# Patient Record
Sex: Male | Born: 1940 | Race: White | Hispanic: No | Marital: Married | Smoking: Never smoker
Health system: Southern US, Community
[De-identification: ages and names within clinical notes are randomized; demographics above are authoritative.]

## PROBLEM LIST (undated history)

## (undated) DIAGNOSIS — G4733 Obstructive sleep apnea (adult) (pediatric): Secondary | ICD-10-CM

## (undated) DIAGNOSIS — E119 Type 2 diabetes mellitus without complications: Secondary | ICD-10-CM

## (undated) DIAGNOSIS — M1990 Unspecified osteoarthritis, unspecified site: Secondary | ICD-10-CM

## (undated) DIAGNOSIS — I10 Essential (primary) hypertension: Secondary | ICD-10-CM

## (undated) DIAGNOSIS — E1143 Type 2 diabetes mellitus with diabetic autonomic (poly)neuropathy: Secondary | ICD-10-CM

## (undated) DIAGNOSIS — K229 Disease of esophagus, unspecified: Secondary | ICD-10-CM

## (undated) DIAGNOSIS — I679 Cerebrovascular disease, unspecified: Secondary | ICD-10-CM

## (undated) DIAGNOSIS — E7800 Pure hypercholesterolemia, unspecified: Secondary | ICD-10-CM

## (undated) DIAGNOSIS — F411 Generalized anxiety disorder: Secondary | ICD-10-CM

## (undated) DIAGNOSIS — R251 Tremor, unspecified: Secondary | ICD-10-CM

## (undated) DIAGNOSIS — Z9989 Dependence on other enabling machines and devices: Secondary | ICD-10-CM

## (undated) HISTORY — DX: Essential (primary) hypertension: I10

## (undated) HISTORY — PX: KNEE ARTHROSCOPY: SHX127

## (undated) HISTORY — DX: Generalized anxiety disorder: F41.1

## (undated) HISTORY — PX: CARPAL TUNNEL RELEASE: SHX101

## (undated) HISTORY — DX: Type 2 diabetes mellitus with diabetic autonomic (poly)neuropathy: E11.43

## (undated) HISTORY — DX: Type 2 diabetes mellitus without complications: E11.9

## (undated) HISTORY — DX: Obstructive sleep apnea (adult) (pediatric): G47.33

## (undated) HISTORY — DX: Disease of esophagus, unspecified: K22.9

## (undated) HISTORY — PX: POLYPECTOMY: SHX149

## (undated) HISTORY — DX: Obstructive sleep apnea (adult) (pediatric): Z99.89

## (undated) HISTORY — DX: Pure hypercholesterolemia, unspecified: E78.00

## (undated) HISTORY — PX: ROTATOR CUFF REPAIR: SHX139

## (undated) HISTORY — PX: HEMORRHOID SURGERY: SHX153

## (undated) HISTORY — DX: Unspecified osteoarthritis, unspecified site: M19.90

## (undated) HISTORY — DX: Tremor, unspecified: R25.1

## (undated) HISTORY — PX: VASECTOMY: SHX75

## (undated) HISTORY — DX: Cerebrovascular disease, unspecified: I67.9

---

## 2006-11-13 ENCOUNTER — Inpatient Hospital Stay (HOSPITAL_COMMUNITY): Admission: RE | Admit: 2006-11-13 | Discharge: 2006-11-15 | Payer: Self-pay | Admitting: Orthopedic Surgery

## 2007-04-11 ENCOUNTER — Ambulatory Visit (HOSPITAL_BASED_OUTPATIENT_CLINIC_OR_DEPARTMENT_OTHER): Admission: RE | Admit: 2007-04-11 | Discharge: 2007-04-11 | Payer: Self-pay | Admitting: Orthopedic Surgery

## 2010-10-26 NOTE — H&P (Signed)
NAME:  Jacob Patel, Jacob Patel NO.:  [id number]   MEDICAL RECORD NO.:  [id number]          PATIENT TYPE:  INP   LOCATION:  NA                           FACILITY:  [facility]   PHYSICIAN:  Mila Homer. Sherlean Foot, M.D. DATE OF BIRTH:  1941-05-11   DATE OF ADMISSION:  11/13/2006  DATE OF DISCHARGE:                              HISTORY & PHYSICAL   CHIEF COMPLAINT:  Left knee pain for the last 5-10 years.   HISTORY OF PRESENT ILLNESS:  This 70 year old white male patient  presented to Dr. Sherlean Foot with a 5-10-year history of gradual onset  progressively worsening left knee pain.  He does have a history of a  left knee arthroscopy done in 2001 by Dr. Bevely Palmer but has had no other  injury or any other surgery to the knee.  At this point the left knee  pain is described as an intermittent ache to sharp sensation mostly over  the medial joint line without radiation.  Pain increases with  weightbearing and then decreases if he limits the amount of  weightbearing he does.  The knee does catch, grind and swell at times  but there is no popping, locking or giving way.  Does not keep him up at  night.  He has received cortisone injections in the past with a moderate  amount of relief and is not taking any other meds for pain at this time.  He has not received viscous supplementation and he does not ambulate  with any assistive devices.   ALLERGIES:  No known drug allergies.   CURRENT MEDICATIONS:  1. Enalapril/hydrochlorothiazide 10/25 mg 1 tablet p.o. q.a.m..  2. Coreg 6.25 mg 1 tablet p.o. b.i.d..  3. Prevacid 30 mg 1 tablet p.o. q.p.m.Marland Kitchen  4. Fexofenadine 180 mg 1 tablet p.o. q.p.m..  5. Crestor 20 mg 1 tablet p.o. q.p.m..  6. Metformin HCL 500 mg 1 tablet p.o. b.i.d..  7. Nasonex nasal spray 50 mcg 2 sprays in each nares q.a.m..  8. Astelin nasal spray 137 mcg two squirts in each nares q.a.m..  #.  Citrucel tablets 2 tablets p.o. b.i.d..  #.  Chondroitin and glucosamine 2 tablets p.o. q.a.m.  which he quit  several days ago.   PAST MEDICAL HISTORY:  1. Hypertension.  2. Gastroesophageal reflux disease.  3. Allergies.  4. Type 2 diabetes mellitus just recently diagnosed.  5. Hypercholesterolemia.  6. Sleep apnea which he uses a CPAP.   PAST SURGICAL HISTORY:  1. Left knee arthroscopy 2001 by Dr. Bevely Palmer.  2. Hemorrhoidectomy 2006.  3. Right carpal tunnel release by Dr. Bevely Palmer 1995.  4. Removal of multiple skin cancers.  5. Excision of a lipoma in February 2008.  He denies any complications      from as above mentioned procedures.   SOCIAL HISTORY:  He denies any history of cigarette smoking, alcohol use  or drug use.  He is married and lives with his wife in a two-story  house.  He has two living children. one child who was stillborn.  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 in October 1999.  His medical  doctor is Dr. Gwendlyn Deutscher in [place].   FAMILY HISTORY:  Mother is alive at age 36 with hypertension.  Father  died at age of 61 with heart disease, heart attack and hypertension.  He  has three brothers and three sisters all alive.  Brothers with a history  of hypertension, one sister with a history of brain type tumor.  Siblings range in age from 73-63.  His daughter is 73, son is 63 and  they are both alive and well.  His one son died was stillborn.   REVIEW OF SYSTEMS:  He does have a history of hypertension and  hemorrhoids which we mentioned earlier.  He does have sleep apnea which  he uses a CPAP.  All other systems were negative and noncontributory.   PHYSICAL EXAMINATION:  Well-developed, well-nourished white male in no  acute distress.  Talks easily with examiner.  Mood and affect are  appropriate.  Walks with a regular gait.  No assistive devices.  Mood  and affect are appropriate.  Height 5 feet 10 inches, weight 235 pounds,  BMI is 33.  VITAL SIGNS:  Temp: 98.3 degrees  Fahrenheit, pulse 80, respirations 18  and BP 138/72.  HEENT:  Normocephalic, atraumatic without frontal or maxillary sinus  tenderness to palpation.  Conjunctivae pink.  Sclerae anicteric.  PERLA.  EOMs intact.  No visible external ear deformities.  Hearing grossly  intact.  Tympanic membranes pearly gray bilaterally with good light  reflex.  Nose: The nasal septum midline.  Nasal mucosa pink and moist  without exudates or polyps noted.  Buccal mucosa pink and moist.  Dentition in good repair.  Pharynx without erythema or exudates.  Tongue  and uvula midline.  Tongue without fasciculations and uvula rises  equally with phonation.  NECK:  No visible masses or lesions noted.  Trachea midline.  No  palpable lymphadenopathy nor thyromegaly.  Carotids +2 bilaterally  without bruits.  Full range of motion nontender to palpation along the  cervical spine.  CARDIOVASCULAR:  Heart rate and rhythm regular.  S1-S2 present without  rubs, clicks or murmurs noted.  RESPIRATORY:  Respirations even and unlabored.  Breath sounds clear to  auscultation bilaterally without rales or wheezes noted.  ABDOMEN:  Rounded abdominal contour.  Bowel sounds present x4 quadrants.  Soft, nontender to palpation without hepatosplenomegaly nor CVA  tenderness.  Femoral pulses +1 bilaterally.  Nontender to palpation  along the vertebral column.  BREASTS/GU/RECTAL:  These exams deferred at this time.  MUSCULOSKELETAL:  No obvious deformities bilateral upper extremities  with full range of motion these extremities without pain.  Radial pulses  +2 bilaterally.  He has full range of motion of his hips, ankles and  toes bilaterally.  DP and PT pulses are +2.  No calf pain with  palpation.  Negative Homans' sign bilaterally.   Right knee has full extension and flexion to 125 degrees with minimal  crepitus.  Skin is intact.  No erythema or ecchymosis.  There is no pain with palpation along the joint line.  No effusion.   Stable to varus  valgus stress.  Negative anterior drawer.  Left knee skin is intact.  No  erythema or ecchymosis.  He does have a +2 effusion in the knee and full  extension of the knee but flexion to 110 degrees with a moderate amount  of crepitus.  He is tender to palpation over the  medial joint line none  laterally.  Stable to varus and valgus stress.  Negative anterior  drawer.  NEUROLOGIC::  Alert and oriented x3.  Cranial nerves II-XII are grossly  intact.  Strength 5/5 bilateral upper and lower extremities.  Rapid  alternating movements intact.  Deep tendon reflexes 2+ bilateral upper  and lower extremities.  Sensation intact to light touch.   RADIOLOGIC FINDINGS:  X-rays taken of his left knee in March 2008 show  severe osteoarthritis in the medial and patellofemoral compartments of  the knee.   IMPRESSION:  1. End-stage osteoarthritis left knee.  2. Hypertension.  3. Type 2 diabetes mellitus.  4. Hypercholesterolemia.  5. Gastroesophageal reflux disease.  6. Allergies.  7. Sleep apnea.   PLAN:  The patient will be admitted to [facility] on November 13, 2006 where he will undergo a left total knee arthroplasty by Dr. Mila Homer. Lucey.  He will undergo all the routine preoperative laboratory tests  and studies prior to this procedure.  If there any medical issues while  he is hospitalized we will consult one the hospitalists.      Legrand Pitts Duffy, P.A.    ______________________________  Mila Homer. Sherlean Foot, M.D.    KED/MEDQ  D:  11/07/2006  T:  11/07/2006  Job:  [id number]

## 2010-10-26 NOTE — Op Note (Signed)
NAME:  Jacob Patel, Jacob Patel NO.:  [id number]   MEDICAL RECORD NO.:  [id number]          PATIENT TYPE:  INP   LOCATION:  2899                         FACILITY:  [facility]   PHYSICIAN:  Mila Homer. Sherlean Foot, M.D. DATE OF BIRTH:  1940/12/01   DATE OF PROCEDURE:  11/13/2006  DATE OF DISCHARGE:                               OPERATIVE REPORT   SURGEON:  Mila Homer. Sherlean Foot, M.D.   ASSISTANTArlys John D. Petrarca, P.A.-C.   ANESTHESIA:  General.   PREOPERATIVE DIAGNOSIS:  Left knee osteoarthritis.   POSTOPERATIVE DIAGNOSIS:  Left knee osteoarthritis.   PROCEDURE:  Left total knee arthroplasty.   INDICATIONS FOR PROCEDURE:  The patient is a 70 year old white male with  failure of conservative measures for osteoarthritis of the left knee.  Informed consent was obtained.   DESCRIPTION OF PROCEDURE:  The patient was laid supine and administered  general anesthesia.  The Foley catheter was placed.  The left leg was  prepped and draped in the usual sterile fashion.  The extremity was  exsanguinated with Esmarch and tourniquet inflated to 350 mmHg and set  for an hour.   I made a midline incision with a #10 blade that was 8 inches in length.  I used a fresh blade to make a median parapatellar arthrotomy and  perform a synovectomy.  I elevated the deep MCL off the medial crest of  the tibia with a #10 blade as well and released the MCL somewhat as this  was a varus knee.  I then used the extramedullary alignment system on  the tibia to make a perpendicular cut to the anatomic axis of the tibia  and removed 2 mm of bone off the inside.  I used the patellar clamp,  resected 9 mm with a patellar reamer and drilled holes through a 35-mm  template to recreate the 23-mm thickness.  I then used the  intramedullary alignment system on the femur with 6 degree valgus cut.  It measured to a size F.  With the F four-in-one cutting block into  place, I made the anterior-posterior chamfer cuts.  I  removed the cut  surfaces of bone.  I placed a lamina spreader in the knee and then  removed the ACL, PCL, medial and lateral menisci and posterior condylar  osteophytes.  A 10-mm spacer block fit well.  A 12-mm spacer block fit.  We were able to do a little bit more medial releasing.  I then finished  the femur with a size F block drill and keel cutting for the box and  drilling for the lag holes.  I used the size 7 tibial tray drill and  keel to purvey the tibia and then trialed with a size F femur, 7 tibia,  12 insert, 35 patella and had excellent flexion/extension gap balance,  excellent patellar tracking.  A dropped in and angled back to 125-130  degrees easily.  I then removed it, components were copiously  irrigated.  I then cemented in the components, removed excess cement and  allowed the cement harden in extension.   I placed a  Hemovac coming out superolateral and deep in the arthrotomy.  I placed a pain catheter coming out superomedial and superficial of the  arthrotomy.  We let the tourniquet down, when the cement was hard,  at  51 minutes.  I obtained hemostasis and irrigated again.  I then closed  the arthrotomy with figure-of-eight #1 Vicryl sutures, deep soft tissues  with interrupted 0 Vicryl sutures in a subcuticular 2-0 Vicryl stitch  and the skin with staples.  Dressed with Xeroform dressing, sponges,  sterile Webril and TED stocking.   COMPLICATIONS:  None.   DRAINS:  Hemovac one.  Pain catheter.   ESTIMATED BLOOD LOSS:  Three hundred mL.   TOURNIQUET TIME:  Fifty-one minutes.           ______________________________  Mila Homer Sherlean Foot, M.D.     SDL/MEDQ  D:  11/13/2006  T:  11/13/2006  Job:  [id number]

## 2010-10-26 NOTE — Op Note (Signed)
NAME:  Jacob Patel, Jacob Patel NO.:  [id number]   MEDICAL RECORD NO.:  [id number]          PATIENT TYPE:  AMB   LOCATION:  DSC                          FACILITY:  [facility]   PHYSICIAN:  Mila Homer. Sherlean Foot, M.D. DATE OF BIRTH:  14-Aug-1940   DATE OF PROCEDURE:  04/11/2007  DATE OF DISCHARGE:  04/11/2007                               OPERATIVE REPORT   SURGEON:  Mila Homer. Sherlean Foot, M.D.   ASSISTANT:  Rexene Edison, P.A.   ANESTHESIA:  General.   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   PROCEDURE:  Right shoulder arthroscopy, subacromial decompression,  glenohumeral debridement and mini open rotator cuff repair.   INDICATIONS FOR PROCEDURE:  The patient is a 70 year old white male with  MRI evidence of a large rotator cuff tear.  Informed consent was  obtained.   DESCRIPTION OF PROCEDURE:  The patient was laid supine and administered  general anesthesia, then placed in the beach-chair position.  The right  shoulder was prepped and draped in the usual sterile fashion.  Anterior,  posterior and direct lateral portals were created a #11 blade, blunt  trocar and cannula.  Diagnostic arthroscopy revealed an anterior labral  tear; this was debrided through the anterior portal with a 3.5 Gator  shaver.  I then redirected the scope to the subacromial space.  From the  direct lateral portal, I performed bursectomy with the Gator shaver.  I  then performed an anterolateral acromioplasty all the way to and then  into the distal clavicle, performing a distal clavicle excision through  the direct lateral anterior portals.  I then debrided the rotator cuff;  this was a large tear approximately 5-6 cm in length.  I then converted  to the mini open technique by extending the direct  lateral portal up 3  cm and holding the deltoid retracted with the Arthrex shoulder  retractor, but not taking any of the deltoid off the acromion, but  splitting it through it raphe.  I then denuded the barrier of the  humerus with a 4-mm cylindrical bur and placed four 5.5 Bio-corkscrews.  I placed 4 modified Mason Balgobin sutures, repairing the rotator cuff into  the bony trough; this afforded excellent closure.  I then irrigated and  closed with 0 and 2-0 Vicryls and Steri-Strips throughout.   COMPLICATIONS:  None.   DRAINS:  None.           ______________________________  Mila Homer. Sherlean Foot, M.D.     SDL/MEDQ  D:  04/11/2007  T:  04/12/2007  Job:  [id number]

## 2011-03-23 LAB — I-STAT 8, (EC8 V) (CONVERTED LAB)
Acid-Base Excess: 3 — ABNORMAL HIGH
Bicarbonate: 29.7 — ABNORMAL HIGH
Glucose, Bld: 93
Hemoglobin: 14.3
Potassium: 3.5
Sodium: 140
TCO2: 31
pH, Ven: 7.345 — ABNORMAL HIGH

## 2011-03-31 LAB — BASIC METABOLIC PANEL
BUN: 10
BUN: 9
CO2: 26
Calcium: 8.3 — ABNORMAL LOW
Creatinine, Ser: 0.92
GFR calc non Af Amer: >60
GFR calc non Af Amer: >60
Glucose, Bld: 121 — ABNORMAL HIGH
Glucose, Bld: 125 — ABNORMAL HIGH
Potassium: 3.3 — ABNORMAL LOW
Potassium: 3.5
Sodium: 135

## 2011-03-31 LAB — CBC
HCT: 34.3 — ABNORMAL LOW
Hemoglobin: 11.8 — ABNORMAL LOW
MCHC: 34.3
Platelets: 157
Platelets: 194
RDW: 13
RDW: 13.5
WBC: 7.9

## 2011-07-21 DIAGNOSIS — M7150 Other bursitis, not elsewhere classified, unspecified site: Secondary | ICD-10-CM | POA: Diagnosis not present

## 2011-07-21 DIAGNOSIS — M722 Plantar fascial fibromatosis: Secondary | ICD-10-CM | POA: Diagnosis not present

## 2011-08-04 DIAGNOSIS — E119 Type 2 diabetes mellitus without complications: Secondary | ICD-10-CM | POA: Diagnosis not present

## 2011-08-04 DIAGNOSIS — M7150 Other bursitis, not elsewhere classified, unspecified site: Secondary | ICD-10-CM | POA: Diagnosis not present

## 2011-08-04 DIAGNOSIS — M722 Plantar fascial fibromatosis: Secondary | ICD-10-CM | POA: Diagnosis not present

## 2011-08-10 DIAGNOSIS — E7800 Pure hypercholesterolemia, unspecified: Secondary | ICD-10-CM | POA: Diagnosis not present

## 2011-08-10 DIAGNOSIS — M545 Low back pain: Secondary | ICD-10-CM | POA: Diagnosis not present

## 2011-08-10 DIAGNOSIS — E119 Type 2 diabetes mellitus without complications: Secondary | ICD-10-CM | POA: Diagnosis not present

## 2011-08-10 DIAGNOSIS — K219 Gastro-esophageal reflux disease without esophagitis: Secondary | ICD-10-CM | POA: Diagnosis not present

## 2011-09-22 DIAGNOSIS — M7150 Other bursitis, not elsewhere classified, unspecified site: Secondary | ICD-10-CM | POA: Diagnosis not present

## 2011-09-22 DIAGNOSIS — M722 Plantar fascial fibromatosis: Secondary | ICD-10-CM | POA: Diagnosis not present

## 2011-12-21 DIAGNOSIS — D126 Benign neoplasm of colon, unspecified: Secondary | ICD-10-CM | POA: Diagnosis not present

## 2011-12-21 DIAGNOSIS — Z86010 Personal history of colonic polyps: Secondary | ICD-10-CM | POA: Diagnosis not present

## 2011-12-21 DIAGNOSIS — K5730 Diverticulosis of large intestine without perforation or abscess without bleeding: Secondary | ICD-10-CM | POA: Diagnosis not present

## 2012-01-25 DIAGNOSIS — Z85828 Personal history of other malignant neoplasm of skin: Secondary | ICD-10-CM | POA: Diagnosis not present

## 2012-01-25 DIAGNOSIS — L570 Actinic keratosis: Secondary | ICD-10-CM | POA: Diagnosis not present

## 2012-01-25 DIAGNOSIS — D0439 Carcinoma in situ of skin of other parts of face: Secondary | ICD-10-CM | POA: Diagnosis not present

## 2012-03-07 DIAGNOSIS — Z85828 Personal history of other malignant neoplasm of skin: Secondary | ICD-10-CM | POA: Diagnosis not present

## 2012-03-23 DIAGNOSIS — J342 Deviated nasal septum: Secondary | ICD-10-CM | POA: Diagnosis not present

## 2012-03-23 DIAGNOSIS — J309 Allergic rhinitis, unspecified: Secondary | ICD-10-CM | POA: Diagnosis not present

## 2012-03-23 DIAGNOSIS — J343 Hypertrophy of nasal turbinates: Secondary | ICD-10-CM | POA: Diagnosis not present

## 2012-07-30 DIAGNOSIS — E119 Type 2 diabetes mellitus without complications: Secondary | ICD-10-CM | POA: Diagnosis not present

## 2012-07-30 DIAGNOSIS — K219 Gastro-esophageal reflux disease without esophagitis: Secondary | ICD-10-CM | POA: Diagnosis not present

## 2012-07-30 DIAGNOSIS — E7800 Pure hypercholesterolemia, unspecified: Secondary | ICD-10-CM | POA: Diagnosis not present

## 2012-07-30 DIAGNOSIS — I10 Essential (primary) hypertension: Secondary | ICD-10-CM | POA: Diagnosis not present

## 2012-09-05 DIAGNOSIS — L570 Actinic keratosis: Secondary | ICD-10-CM | POA: Diagnosis not present

## 2012-09-05 DIAGNOSIS — D0420 Carcinoma in situ of skin of unspecified ear and external auricular canal: Secondary | ICD-10-CM | POA: Diagnosis not present

## 2012-09-05 DIAGNOSIS — Z85828 Personal history of other malignant neoplasm of skin: Secondary | ICD-10-CM | POA: Diagnosis not present

## 2012-09-05 DIAGNOSIS — D0439 Carcinoma in situ of skin of other parts of face: Secondary | ICD-10-CM | POA: Diagnosis not present

## 2012-09-18 DIAGNOSIS — E119 Type 2 diabetes mellitus without complications: Secondary | ICD-10-CM | POA: Diagnosis not present

## 2013-01-07 DIAGNOSIS — Z85828 Personal history of other malignant neoplasm of skin: Secondary | ICD-10-CM | POA: Diagnosis not present

## 2013-01-07 DIAGNOSIS — L570 Actinic keratosis: Secondary | ICD-10-CM | POA: Diagnosis not present

## 2013-05-08 DIAGNOSIS — E119 Type 2 diabetes mellitus without complications: Secondary | ICD-10-CM | POA: Diagnosis not present

## 2013-05-08 DIAGNOSIS — M24573 Contracture, unspecified ankle: Secondary | ICD-10-CM | POA: Diagnosis not present

## 2013-05-08 DIAGNOSIS — M2040 Other hammer toe(s) (acquired), unspecified foot: Secondary | ICD-10-CM | POA: Diagnosis not present

## 2013-05-08 DIAGNOSIS — M722 Plantar fascial fibromatosis: Secondary | ICD-10-CM | POA: Diagnosis not present

## 2013-05-08 DIAGNOSIS — M7150 Other bursitis, not elsewhere classified, unspecified site: Secondary | ICD-10-CM | POA: Diagnosis not present

## 2013-05-30 DIAGNOSIS — J111 Influenza due to unidentified influenza virus with other respiratory manifestations: Secondary | ICD-10-CM | POA: Diagnosis not present

## 2013-06-12 DIAGNOSIS — M722 Plantar fascial fibromatosis: Secondary | ICD-10-CM | POA: Diagnosis not present

## 2013-06-12 DIAGNOSIS — M7150 Other bursitis, not elsewhere classified, unspecified site: Secondary | ICD-10-CM | POA: Diagnosis not present

## 2013-06-12 DIAGNOSIS — E119 Type 2 diabetes mellitus without complications: Secondary | ICD-10-CM | POA: Diagnosis not present

## 2013-07-24 DIAGNOSIS — L570 Actinic keratosis: Secondary | ICD-10-CM | POA: Diagnosis not present

## 2013-07-24 DIAGNOSIS — D234 Other benign neoplasm of skin of scalp and neck: Secondary | ICD-10-CM | POA: Diagnosis not present

## 2013-07-24 DIAGNOSIS — Z85828 Personal history of other malignant neoplasm of skin: Secondary | ICD-10-CM | POA: Diagnosis not present

## 2013-08-14 DIAGNOSIS — E119 Type 2 diabetes mellitus without complications: Secondary | ICD-10-CM | POA: Diagnosis not present

## 2013-08-14 DIAGNOSIS — E7800 Pure hypercholesterolemia, unspecified: Secondary | ICD-10-CM | POA: Diagnosis not present

## 2013-08-14 DIAGNOSIS — I10 Essential (primary) hypertension: Secondary | ICD-10-CM | POA: Diagnosis not present

## 2013-09-19 DIAGNOSIS — H524 Presbyopia: Secondary | ICD-10-CM | POA: Diagnosis not present

## 2013-09-19 DIAGNOSIS — E119 Type 2 diabetes mellitus without complications: Secondary | ICD-10-CM | POA: Diagnosis not present

## 2014-01-22 DIAGNOSIS — Z85828 Personal history of other malignant neoplasm of skin: Secondary | ICD-10-CM | POA: Diagnosis not present

## 2014-01-22 DIAGNOSIS — L570 Actinic keratosis: Secondary | ICD-10-CM | POA: Diagnosis not present

## 2014-03-10 DIAGNOSIS — K219 Gastro-esophageal reflux disease without esophagitis: Secondary | ICD-10-CM | POA: Diagnosis not present

## 2014-03-10 DIAGNOSIS — I10 Essential (primary) hypertension: Secondary | ICD-10-CM | POA: Diagnosis not present

## 2014-03-10 DIAGNOSIS — E7800 Pure hypercholesterolemia, unspecified: Secondary | ICD-10-CM | POA: Diagnosis not present

## 2014-03-10 DIAGNOSIS — J309 Allergic rhinitis, unspecified: Secondary | ICD-10-CM | POA: Diagnosis not present

## 2014-05-06 DIAGNOSIS — E785 Hyperlipidemia, unspecified: Secondary | ICD-10-CM | POA: Diagnosis not present

## 2014-05-06 DIAGNOSIS — I10 Essential (primary) hypertension: Secondary | ICD-10-CM | POA: Diagnosis not present

## 2014-05-06 DIAGNOSIS — E119 Type 2 diabetes mellitus without complications: Secondary | ICD-10-CM | POA: Diagnosis not present

## 2014-05-06 DIAGNOSIS — R0602 Shortness of breath: Secondary | ICD-10-CM | POA: Diagnosis not present

## 2014-05-26 DIAGNOSIS — E119 Type 2 diabetes mellitus without complications: Secondary | ICD-10-CM | POA: Diagnosis not present

## 2014-08-13 DIAGNOSIS — L570 Actinic keratosis: Secondary | ICD-10-CM | POA: Diagnosis not present

## 2014-08-13 DIAGNOSIS — Z85828 Personal history of other malignant neoplasm of skin: Secondary | ICD-10-CM | POA: Diagnosis not present

## 2014-08-13 DIAGNOSIS — Z08 Encounter for follow-up examination after completed treatment for malignant neoplasm: Secondary | ICD-10-CM | POA: Diagnosis not present

## 2014-08-20 DIAGNOSIS — E119 Type 2 diabetes mellitus without complications: Secondary | ICD-10-CM | POA: Diagnosis not present

## 2014-08-27 DIAGNOSIS — E780 Pure hypercholesterolemia: Secondary | ICD-10-CM | POA: Diagnosis not present

## 2014-08-27 DIAGNOSIS — E1143 Type 2 diabetes mellitus with diabetic autonomic (poly)neuropathy: Secondary | ICD-10-CM | POA: Diagnosis not present

## 2014-08-27 DIAGNOSIS — Z1389 Encounter for screening for other disorder: Secondary | ICD-10-CM | POA: Diagnosis not present

## 2014-08-27 DIAGNOSIS — Z9181 History of falling: Secondary | ICD-10-CM | POA: Diagnosis not present

## 2014-09-22 DIAGNOSIS — E119 Type 2 diabetes mellitus without complications: Secondary | ICD-10-CM | POA: Diagnosis not present

## 2014-12-02 DIAGNOSIS — J209 Acute bronchitis, unspecified: Secondary | ICD-10-CM | POA: Diagnosis not present

## 2014-12-19 DIAGNOSIS — E119 Type 2 diabetes mellitus without complications: Secondary | ICD-10-CM | POA: Diagnosis not present

## 2014-12-26 DIAGNOSIS — R252 Cramp and spasm: Secondary | ICD-10-CM | POA: Diagnosis not present

## 2014-12-26 DIAGNOSIS — E780 Pure hypercholesterolemia: Secondary | ICD-10-CM | POA: Diagnosis not present

## 2014-12-26 DIAGNOSIS — I10 Essential (primary) hypertension: Secondary | ICD-10-CM | POA: Diagnosis not present

## 2014-12-26 DIAGNOSIS — E1165 Type 2 diabetes mellitus with hyperglycemia: Secondary | ICD-10-CM | POA: Diagnosis not present

## 2015-01-23 DIAGNOSIS — I10 Essential (primary) hypertension: Secondary | ICD-10-CM | POA: Diagnosis not present

## 2015-01-23 DIAGNOSIS — E780 Pure hypercholesterolemia: Secondary | ICD-10-CM | POA: Diagnosis not present

## 2015-01-23 DIAGNOSIS — G4733 Obstructive sleep apnea (adult) (pediatric): Secondary | ICD-10-CM | POA: Diagnosis not present

## 2015-01-23 DIAGNOSIS — E1165 Type 2 diabetes mellitus with hyperglycemia: Secondary | ICD-10-CM | POA: Diagnosis not present

## 2015-01-29 DIAGNOSIS — E119 Type 2 diabetes mellitus without complications: Secondary | ICD-10-CM | POA: Diagnosis not present

## 2015-01-29 DIAGNOSIS — E782 Mixed hyperlipidemia: Secondary | ICD-10-CM | POA: Diagnosis not present

## 2015-01-29 DIAGNOSIS — E669 Obesity, unspecified: Secondary | ICD-10-CM | POA: Diagnosis not present

## 2015-01-29 DIAGNOSIS — K219 Gastro-esophageal reflux disease without esophagitis: Secondary | ICD-10-CM | POA: Diagnosis not present

## 2015-02-18 DIAGNOSIS — Z08 Encounter for follow-up examination after completed treatment for malignant neoplasm: Secondary | ICD-10-CM | POA: Diagnosis not present

## 2015-02-18 DIAGNOSIS — L570 Actinic keratosis: Secondary | ICD-10-CM | POA: Diagnosis not present

## 2015-02-18 DIAGNOSIS — Z85828 Personal history of other malignant neoplasm of skin: Secondary | ICD-10-CM | POA: Diagnosis not present

## 2015-05-01 DIAGNOSIS — F419 Anxiety disorder, unspecified: Secondary | ICD-10-CM | POA: Diagnosis not present

## 2015-05-01 DIAGNOSIS — E669 Obesity, unspecified: Secondary | ICD-10-CM | POA: Diagnosis not present

## 2015-05-01 DIAGNOSIS — E785 Hyperlipidemia, unspecified: Secondary | ICD-10-CM | POA: Diagnosis not present

## 2015-05-01 DIAGNOSIS — E119 Type 2 diabetes mellitus without complications: Secondary | ICD-10-CM | POA: Diagnosis not present

## 2015-08-17 DIAGNOSIS — E1143 Type 2 diabetes mellitus with diabetic autonomic (poly)neuropathy: Secondary | ICD-10-CM | POA: Diagnosis not present

## 2015-08-17 DIAGNOSIS — E7800 Pure hypercholesterolemia, unspecified: Secondary | ICD-10-CM | POA: Diagnosis not present

## 2015-08-17 DIAGNOSIS — Z0000 Encounter for general adult medical examination without abnormal findings: Secondary | ICD-10-CM | POA: Diagnosis not present

## 2015-08-17 DIAGNOSIS — I10 Essential (primary) hypertension: Secondary | ICD-10-CM | POA: Diagnosis not present

## 2015-08-17 DIAGNOSIS — D649 Anemia, unspecified: Secondary | ICD-10-CM | POA: Diagnosis not present

## 2015-08-19 DIAGNOSIS — Z85828 Personal history of other malignant neoplasm of skin: Secondary | ICD-10-CM | POA: Diagnosis not present

## 2015-08-19 DIAGNOSIS — Z08 Encounter for follow-up examination after completed treatment for malignant neoplasm: Secondary | ICD-10-CM | POA: Diagnosis not present

## 2015-08-19 DIAGNOSIS — L570 Actinic keratosis: Secondary | ICD-10-CM | POA: Diagnosis not present

## 2015-09-28 DIAGNOSIS — H524 Presbyopia: Secondary | ICD-10-CM | POA: Diagnosis not present

## 2015-09-28 DIAGNOSIS — H2513 Age-related nuclear cataract, bilateral: Secondary | ICD-10-CM | POA: Diagnosis not present

## 2015-09-28 DIAGNOSIS — E119 Type 2 diabetes mellitus without complications: Secondary | ICD-10-CM | POA: Diagnosis not present

## 2015-12-30 DIAGNOSIS — E1143 Type 2 diabetes mellitus with diabetic autonomic (poly)neuropathy: Secondary | ICD-10-CM | POA: Diagnosis not present

## 2015-12-30 DIAGNOSIS — Z9181 History of falling: Secondary | ICD-10-CM | POA: Diagnosis not present

## 2015-12-30 DIAGNOSIS — Z1389 Encounter for screening for other disorder: Secondary | ICD-10-CM | POA: Diagnosis not present

## 2015-12-30 DIAGNOSIS — I10 Essential (primary) hypertension: Secondary | ICD-10-CM | POA: Diagnosis not present

## 2016-02-22 DIAGNOSIS — Z08 Encounter for follow-up examination after completed treatment for malignant neoplasm: Secondary | ICD-10-CM | POA: Diagnosis not present

## 2016-02-22 DIAGNOSIS — Z85828 Personal history of other malignant neoplasm of skin: Secondary | ICD-10-CM | POA: Diagnosis not present

## 2016-02-22 DIAGNOSIS — L570 Actinic keratosis: Secondary | ICD-10-CM | POA: Diagnosis not present

## 2016-05-09 DIAGNOSIS — D485 Neoplasm of uncertain behavior of skin: Secondary | ICD-10-CM | POA: Diagnosis not present

## 2016-05-09 DIAGNOSIS — L820 Inflamed seborrheic keratosis: Secondary | ICD-10-CM | POA: Diagnosis not present

## 2016-08-22 DIAGNOSIS — Z85828 Personal history of other malignant neoplasm of skin: Secondary | ICD-10-CM | POA: Diagnosis not present

## 2016-08-22 DIAGNOSIS — L570 Actinic keratosis: Secondary | ICD-10-CM | POA: Diagnosis not present

## 2016-08-22 DIAGNOSIS — Z08 Encounter for follow-up examination after completed treatment for malignant neoplasm: Secondary | ICD-10-CM | POA: Diagnosis not present

## 2016-09-13 DIAGNOSIS — E1165 Type 2 diabetes mellitus with hyperglycemia: Secondary | ICD-10-CM | POA: Diagnosis not present

## 2016-09-22 DIAGNOSIS — E1143 Type 2 diabetes mellitus with diabetic autonomic (poly)neuropathy: Secondary | ICD-10-CM | POA: Diagnosis not present

## 2016-09-22 DIAGNOSIS — Z0000 Encounter for general adult medical examination without abnormal findings: Secondary | ICD-10-CM | POA: Diagnosis not present

## 2016-09-22 DIAGNOSIS — E7800 Pure hypercholesterolemia, unspecified: Secondary | ICD-10-CM | POA: Diagnosis not present

## 2016-09-22 DIAGNOSIS — I10 Essential (primary) hypertension: Secondary | ICD-10-CM | POA: Diagnosis not present

## 2016-09-22 DIAGNOSIS — Z6832 Body mass index (BMI) 32.0-32.9, adult: Secondary | ICD-10-CM | POA: Diagnosis not present

## 2016-09-28 DIAGNOSIS — H2513 Age-related nuclear cataract, bilateral: Secondary | ICD-10-CM | POA: Diagnosis not present

## 2016-09-28 DIAGNOSIS — E119 Type 2 diabetes mellitus without complications: Secondary | ICD-10-CM | POA: Diagnosis not present

## 2016-12-28 DIAGNOSIS — S6000XA Contusion of unspecified finger without damage to nail, initial encounter: Secondary | ICD-10-CM | POA: Diagnosis not present

## 2016-12-28 DIAGNOSIS — L03019 Cellulitis of unspecified finger: Secondary | ICD-10-CM | POA: Diagnosis not present

## 2017-01-03 DIAGNOSIS — E119 Type 2 diabetes mellitus without complications: Secondary | ICD-10-CM | POA: Diagnosis not present

## 2017-01-03 DIAGNOSIS — L600 Ingrowing nail: Secondary | ICD-10-CM | POA: Diagnosis not present

## 2017-03-06 DIAGNOSIS — L570 Actinic keratosis: Secondary | ICD-10-CM | POA: Diagnosis not present

## 2017-03-06 DIAGNOSIS — Z85828 Personal history of other malignant neoplasm of skin: Secondary | ICD-10-CM | POA: Diagnosis not present

## 2017-03-06 DIAGNOSIS — Z08 Encounter for follow-up examination after completed treatment for malignant neoplasm: Secondary | ICD-10-CM | POA: Diagnosis not present

## 2017-03-13 DIAGNOSIS — D125 Benign neoplasm of sigmoid colon: Secondary | ICD-10-CM | POA: Diagnosis not present

## 2017-03-13 DIAGNOSIS — D120 Benign neoplasm of cecum: Secondary | ICD-10-CM | POA: Diagnosis not present

## 2017-03-13 DIAGNOSIS — Z86010 Personal history of colonic polyps: Secondary | ICD-10-CM | POA: Diagnosis not present

## 2017-03-22 DIAGNOSIS — E1165 Type 2 diabetes mellitus with hyperglycemia: Secondary | ICD-10-CM | POA: Diagnosis not present

## 2017-03-29 DIAGNOSIS — E1143 Type 2 diabetes mellitus with diabetic autonomic (poly)neuropathy: Secondary | ICD-10-CM | POA: Diagnosis not present

## 2017-03-29 DIAGNOSIS — Z1331 Encounter for screening for depression: Secondary | ICD-10-CM | POA: Diagnosis not present

## 2017-03-29 DIAGNOSIS — Z9181 History of falling: Secondary | ICD-10-CM | POA: Diagnosis not present

## 2017-03-29 DIAGNOSIS — Z1339 Encounter for screening examination for other mental health and behavioral disorders: Secondary | ICD-10-CM | POA: Diagnosis not present

## 2017-07-06 DIAGNOSIS — J209 Acute bronchitis, unspecified: Secondary | ICD-10-CM | POA: Diagnosis not present

## 2017-07-06 DIAGNOSIS — Z6833 Body mass index (BMI) 33.0-33.9, adult: Secondary | ICD-10-CM | POA: Diagnosis not present

## 2017-07-24 DIAGNOSIS — Z79899 Other long term (current) drug therapy: Secondary | ICD-10-CM | POA: Diagnosis not present

## 2017-07-24 DIAGNOSIS — E785 Hyperlipidemia, unspecified: Secondary | ICD-10-CM | POA: Diagnosis not present

## 2017-07-24 DIAGNOSIS — Z6832 Body mass index (BMI) 32.0-32.9, adult: Secondary | ICD-10-CM | POA: Diagnosis not present

## 2017-07-24 DIAGNOSIS — E1143 Type 2 diabetes mellitus with diabetic autonomic (poly)neuropathy: Secondary | ICD-10-CM | POA: Diagnosis not present

## 2017-07-24 DIAGNOSIS — D519 Vitamin B12 deficiency anemia, unspecified: Secondary | ICD-10-CM | POA: Diagnosis not present

## 2017-07-24 DIAGNOSIS — Z0000 Encounter for general adult medical examination without abnormal findings: Secondary | ICD-10-CM | POA: Diagnosis not present

## 2017-08-15 DIAGNOSIS — H6591 Unspecified nonsuppurative otitis media, right ear: Secondary | ICD-10-CM | POA: Diagnosis not present

## 2017-09-07 DIAGNOSIS — M2550 Pain in unspecified joint: Secondary | ICD-10-CM | POA: Diagnosis not present

## 2017-09-07 DIAGNOSIS — E669 Obesity, unspecified: Secondary | ICD-10-CM | POA: Diagnosis not present

## 2017-09-07 DIAGNOSIS — Z6833 Body mass index (BMI) 33.0-33.9, adult: Secondary | ICD-10-CM | POA: Diagnosis not present

## 2017-09-07 DIAGNOSIS — M150 Primary generalized (osteo)arthritis: Secondary | ICD-10-CM | POA: Diagnosis not present

## 2017-09-08 DIAGNOSIS — Z08 Encounter for follow-up examination after completed treatment for malignant neoplasm: Secondary | ICD-10-CM | POA: Diagnosis not present

## 2017-09-08 DIAGNOSIS — Z85828 Personal history of other malignant neoplasm of skin: Secondary | ICD-10-CM | POA: Diagnosis not present

## 2017-09-08 DIAGNOSIS — L570 Actinic keratosis: Secondary | ICD-10-CM | POA: Diagnosis not present

## 2017-09-18 DIAGNOSIS — J342 Deviated nasal septum: Secondary | ICD-10-CM | POA: Diagnosis not present

## 2017-09-18 DIAGNOSIS — H6981 Other specified disorders of Eustachian tube, right ear: Secondary | ICD-10-CM | POA: Diagnosis not present

## 2017-09-18 DIAGNOSIS — H6501 Acute serous otitis media, right ear: Secondary | ICD-10-CM | POA: Diagnosis not present

## 2017-09-26 DIAGNOSIS — L03031 Cellulitis of right toe: Secondary | ICD-10-CM | POA: Diagnosis not present

## 2017-09-26 DIAGNOSIS — E119 Type 2 diabetes mellitus without complications: Secondary | ICD-10-CM | POA: Diagnosis not present

## 2017-10-02 DIAGNOSIS — E119 Type 2 diabetes mellitus without complications: Secondary | ICD-10-CM | POA: Diagnosis not present

## 2017-10-02 DIAGNOSIS — H25813 Combined forms of age-related cataract, bilateral: Secondary | ICD-10-CM | POA: Diagnosis not present

## 2017-10-10 DIAGNOSIS — L03031 Cellulitis of right toe: Secondary | ICD-10-CM | POA: Diagnosis not present

## 2017-10-10 DIAGNOSIS — E119 Type 2 diabetes mellitus without complications: Secondary | ICD-10-CM | POA: Diagnosis not present

## 2017-10-17 DIAGNOSIS — H6981 Other specified disorders of Eustachian tube, right ear: Secondary | ICD-10-CM | POA: Diagnosis not present

## 2017-10-17 DIAGNOSIS — H6501 Acute serous otitis media, right ear: Secondary | ICD-10-CM | POA: Diagnosis not present

## 2017-11-01 DIAGNOSIS — I10 Essential (primary) hypertension: Secondary | ICD-10-CM | POA: Diagnosis not present

## 2017-11-01 DIAGNOSIS — Z6833 Body mass index (BMI) 33.0-33.9, adult: Secondary | ICD-10-CM | POA: Diagnosis not present

## 2017-11-01 DIAGNOSIS — E1143 Type 2 diabetes mellitus with diabetic autonomic (poly)neuropathy: Secondary | ICD-10-CM | POA: Diagnosis not present

## 2017-11-01 DIAGNOSIS — Z0000 Encounter for general adult medical examination without abnormal findings: Secondary | ICD-10-CM | POA: Diagnosis not present

## 2017-11-01 DIAGNOSIS — E7800 Pure hypercholesterolemia, unspecified: Secondary | ICD-10-CM | POA: Diagnosis not present

## 2017-12-15 DIAGNOSIS — Z8669 Personal history of other diseases of the nervous system and sense organs: Secondary | ICD-10-CM | POA: Diagnosis not present

## 2017-12-15 DIAGNOSIS — J342 Deviated nasal septum: Secondary | ICD-10-CM | POA: Diagnosis not present

## 2018-01-04 DIAGNOSIS — E119 Type 2 diabetes mellitus without complications: Secondary | ICD-10-CM | POA: Diagnosis not present

## 2018-01-04 DIAGNOSIS — L600 Ingrowing nail: Secondary | ICD-10-CM

## 2018-01-04 HISTORY — DX: Ingrowing nail: L60.0

## 2018-01-22 DIAGNOSIS — Z79899 Other long term (current) drug therapy: Secondary | ICD-10-CM | POA: Diagnosis not present

## 2018-01-22 DIAGNOSIS — E785 Hyperlipidemia, unspecified: Secondary | ICD-10-CM | POA: Diagnosis not present

## 2018-01-22 DIAGNOSIS — E1165 Type 2 diabetes mellitus with hyperglycemia: Secondary | ICD-10-CM | POA: Diagnosis not present

## 2018-01-29 DIAGNOSIS — I10 Essential (primary) hypertension: Secondary | ICD-10-CM | POA: Diagnosis not present

## 2018-01-29 DIAGNOSIS — Z6833 Body mass index (BMI) 33.0-33.9, adult: Secondary | ICD-10-CM | POA: Diagnosis not present

## 2018-01-29 DIAGNOSIS — E1143 Type 2 diabetes mellitus with diabetic autonomic (poly)neuropathy: Secondary | ICD-10-CM | POA: Diagnosis not present

## 2018-01-29 DIAGNOSIS — E7800 Pure hypercholesterolemia, unspecified: Secondary | ICD-10-CM | POA: Diagnosis not present

## 2018-03-13 DIAGNOSIS — I781 Nevus, non-neoplastic: Secondary | ICD-10-CM | POA: Diagnosis not present

## 2018-03-13 DIAGNOSIS — L578 Other skin changes due to chronic exposure to nonionizing radiation: Secondary | ICD-10-CM | POA: Diagnosis not present

## 2018-03-13 DIAGNOSIS — L570 Actinic keratosis: Secondary | ICD-10-CM | POA: Diagnosis not present

## 2018-03-13 DIAGNOSIS — Z85828 Personal history of other malignant neoplasm of skin: Secondary | ICD-10-CM | POA: Diagnosis not present

## 2018-03-13 DIAGNOSIS — D1801 Hemangioma of skin and subcutaneous tissue: Secondary | ICD-10-CM | POA: Diagnosis not present

## 2018-03-13 DIAGNOSIS — Z08 Encounter for follow-up examination after completed treatment for malignant neoplasm: Secondary | ICD-10-CM | POA: Diagnosis not present

## 2018-05-15 DIAGNOSIS — R600 Localized edema: Secondary | ICD-10-CM

## 2018-05-15 HISTORY — DX: Localized edema: R60.0

## 2018-05-21 DIAGNOSIS — Z79899 Other long term (current) drug therapy: Secondary | ICD-10-CM | POA: Diagnosis not present

## 2018-05-21 DIAGNOSIS — E1143 Type 2 diabetes mellitus with diabetic autonomic (poly)neuropathy: Secondary | ICD-10-CM | POA: Diagnosis not present

## 2018-05-28 DIAGNOSIS — Z1331 Encounter for screening for depression: Secondary | ICD-10-CM | POA: Diagnosis not present

## 2018-05-28 DIAGNOSIS — Z6834 Body mass index (BMI) 34.0-34.9, adult: Secondary | ICD-10-CM | POA: Diagnosis not present

## 2018-05-28 DIAGNOSIS — E1165 Type 2 diabetes mellitus with hyperglycemia: Secondary | ICD-10-CM | POA: Diagnosis not present

## 2018-05-28 DIAGNOSIS — Z9181 History of falling: Secondary | ICD-10-CM | POA: Diagnosis not present

## 2018-08-28 DIAGNOSIS — D519 Vitamin B12 deficiency anemia, unspecified: Secondary | ICD-10-CM | POA: Diagnosis not present

## 2018-08-28 DIAGNOSIS — Z6834 Body mass index (BMI) 34.0-34.9, adult: Secondary | ICD-10-CM | POA: Diagnosis not present

## 2018-08-28 DIAGNOSIS — K219 Gastro-esophageal reflux disease without esophagitis: Secondary | ICD-10-CM | POA: Diagnosis not present

## 2018-08-28 DIAGNOSIS — Z79899 Other long term (current) drug therapy: Secondary | ICD-10-CM | POA: Diagnosis not present

## 2018-08-28 DIAGNOSIS — E1143 Type 2 diabetes mellitus with diabetic autonomic (poly)neuropathy: Secondary | ICD-10-CM | POA: Diagnosis not present

## 2018-08-28 DIAGNOSIS — Z0000 Encounter for general adult medical examination without abnormal findings: Secondary | ICD-10-CM | POA: Diagnosis not present

## 2018-08-28 DIAGNOSIS — E785 Hyperlipidemia, unspecified: Secondary | ICD-10-CM | POA: Diagnosis not present

## 2018-10-08 DIAGNOSIS — D649 Anemia, unspecified: Secondary | ICD-10-CM | POA: Diagnosis not present

## 2018-10-15 DIAGNOSIS — Z6833 Body mass index (BMI) 33.0-33.9, adult: Secondary | ICD-10-CM | POA: Diagnosis not present

## 2018-10-15 DIAGNOSIS — E1159 Type 2 diabetes mellitus with other circulatory complications: Secondary | ICD-10-CM | POA: Diagnosis not present

## 2018-10-15 DIAGNOSIS — E7800 Pure hypercholesterolemia, unspecified: Secondary | ICD-10-CM | POA: Diagnosis not present

## 2018-10-15 DIAGNOSIS — E1165 Type 2 diabetes mellitus with hyperglycemia: Secondary | ICD-10-CM | POA: Diagnosis not present

## 2018-10-24 ENCOUNTER — Ambulatory Visit: Payer: Medicare Other | Admitting: Neurology

## 2018-11-21 ENCOUNTER — Telehealth: Payer: Self-pay | Admitting: *Deleted

## 2018-11-21 ENCOUNTER — Encounter: Payer: Self-pay | Admitting: *Deleted

## 2018-11-21 NOTE — Telephone Encounter (Signed)
Updated pt chart

## 2018-11-22 ENCOUNTER — Encounter: Payer: Self-pay | Admitting: Neurology

## 2018-11-22 ENCOUNTER — Telehealth: Payer: Self-pay | Admitting: Neurology

## 2018-11-22 ENCOUNTER — Ambulatory Visit (INDEPENDENT_AMBULATORY_CARE_PROVIDER_SITE_OTHER): Payer: Commercial Managed Care - PPO | Admitting: Neurology

## 2018-11-22 ENCOUNTER — Other Ambulatory Visit: Payer: Self-pay

## 2018-11-22 DIAGNOSIS — R413 Other amnesia: Secondary | ICD-10-CM

## 2018-11-22 HISTORY — DX: Other amnesia: R41.3

## 2018-11-22 NOTE — Progress Notes (Signed)
PATIENT: Jacob Patel DOB: 04-13-1941  Virtual Visit via video  I connected with Jacob Patel on 11/22/18 at  by video and verified that I am speaking with the correct person using two identifiers.   I discussed the limitations, risks, security and privacy concerns of performing an evaluation and management service by video and the availability of in person appointments. I also discussed with the patient that there may be a patient responsible charge related to this service. The patient expressed understanding and agreed to proceed.  HISTORICAL  Jacob Patel is a 78 years old male, seen in request by his primary care physician Dr. Lin Landsman, [place] for evaluation of memory loss.  I have reviewed and summarized the referring note from the referring physician.  He has PMHx of HTN, HLD, DM for more than 10 years.  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  He tends to forget people's names, made wrong turns while driving, he still able to keep his book imbalance,  His father had memory loss died at age 30, his mother also suffered mild memory loss at age 62     Observations/Objective: I have reviewed problem lists, medications, allergies. MMSE - Mini Mental State Exam 11/22/2018  Orientation to time 5  Orientation to Place 5  Registration 3  Attention/ Calculation 5  Recall 3  Language- name 2 objects 2  Language- repeat 1  Language- follow 3 step command 3  Language- read & follow direction 1  Write a sentence 1  Copy design 1  Total score 30   Assessment and Plan: Mild Cognitive Impairment   MRI brain  Get Lab result from his PCP    Follow Up Instructions:  In 6 months    I discussed the assessment and treatment plan with the  patient. The patient was provided an opportunity to ask questions and all were answered. The patient agreed with the plan and demonstrated an understanding of the instructions.   The patient was advised to call back or seek an in-person evaluation if the symptoms worsen or if the condition fails to improve as anticipated.  I provided 30 minutes of non-face-to-face time during this encounter.  REVIEW OF SYSTEMS: Full 14 system review of systems performed and notable only for as above All other review of systems were negative.  ALLERGIES: Not on File  HOME MEDICATIONS: Current Outpatient Medications  Medication Sig Dispense Refill  . amLODipine (NORVASC) 5 MG tablet Take 5 mg by mouth daily.    . ASPIRIN 81 PO Take 1 tablet by mouth daily. Childrens-chewable    . carvedilol (COREG) 6.25 MG tablet Take 6.25 mg by mouth 2 (two) times daily with a meal.    . Dulaglutide (TRULICITY) 2.87 OM/7.6HM SOPN Inject 1 Dose into the skin once a week.    . fexofenadine (ALLEGRA) 180 MG tablet Take 180 mg by mouth daily.    . fluticasone (FLONASE) 50 MCG/ACT nasal spray Place into both nostrils daily.    . lansoprazole (PREVACID) 30 MG capsule Take 30 mg by mouth daily at 12 noon.    Marland Kitchen lisinopril (ZESTRIL) 10 MG tablet Take 10 mg by mouth daily.    . metFORMIN (GLUCOPHAGE) 1000 MG tablet Take 1,000  mg by mouth 2 (two) times daily with a meal.    . rosuvastatin (CRESTOR) 40 MG tablet Take 40 mg by mouth daily.     No current facility-administered medications for this visit.     PAST MEDICAL HISTORY: Past Medical History:  Diagnosis Date  . Anxiety state   . 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)   . Benign essential hypertension   . Disease of esophagus, unspecified   . OSA on CPAP   . Osteoarthritis   . Pure hypercholesterolemia     PAST SURGICAL HISTORY: Past Surgical History:  Procedure Laterality Date  . CARPAL TUNNEL RELEASE Right   . KNEE ARTHROSCOPY Left     FAMILY HISTORY:  Family History  Problem Relation Age of Onset  . CAD Mother   . CAD Father   . CAD Brother     SOCIAL HISTORY:   Social History   Socioeconomic History  . Marital status: Married    Spouse name: Not on file  . Number of children: Not on file  . Years of education: Not on file  . Highest education level: Not on file  Occupational History  . Not on file  Social Needs  . Financial resource strain: Not on file  . Food insecurity    Worry: Not on file    Inability: Not on file  . Transportation needs    Medical: Not on file    Non-medical: Not on file  Tobacco Use  . Smoking status: Never Smoker  . Smokeless tobacco: Never Used  Substance and Sexual Activity  . Alcohol use: Never    Frequency: Never  . Drug use: Never  . Sexual activity: Not on file  Lifestyle  . Physical activity    Days per week: Not on file    Minutes per session: Not on file  . Stress: Not on file  Relationships  . Social Herbalist on phone: Not on file    Gets together: Not on file    Attends religious service: Not on file    Active member of club or organization: Not on file    Attends meetings of clubs or organizations: Not on file    Relationship status: Not on file  . Intimate partner violence    Fear of current or ex partner: Not on file    Emotionally abused: Not on file    Physically abused: Not on file    Forced sexual activity: Not on file  Other Topics Concern  . Not on file  Social History Narrative   Caffeine use: mild consumption   Has POA    Marcial Pacas, M.D. Ph.D.  [facility] [address] [place], [place] [address] Ph: [phone] Fax: [phone]  CC: Angelina Sheriff, MD

## 2018-11-22 NOTE — Telephone Encounter (Signed)
Medicare/[facility]arp/ [facility] pending faxed notes. Ref # [id number]

## 2018-11-26 NOTE — Telephone Encounter (Signed)
I called [facility] to check the status it is still pending they did receive the fax of clinical notes.

## 2018-11-28 NOTE — Telephone Encounter (Signed)
Medicare/[facility] Auth: [id number] (exp. 11/22/18 to 02/20/19) order sent to GI. They will reach out to the patient to schedule.

## 2018-11-29 ENCOUNTER — Other Ambulatory Visit: Payer: Self-pay | Admitting: Neurology

## 2018-11-29 DIAGNOSIS — T1590XA Foreign body on external eye, part unspecified, unspecified eye, initial encounter: Secondary | ICD-10-CM

## 2018-12-06 DIAGNOSIS — Z23 Encounter for immunization: Secondary | ICD-10-CM | POA: Diagnosis not present

## 2018-12-06 DIAGNOSIS — S68522A Partial traumatic transphalangeal amputation of left thumb, initial encounter: Secondary | ICD-10-CM | POA: Diagnosis not present

## 2018-12-06 DIAGNOSIS — S61012A Laceration without foreign body of left thumb without damage to nail, initial encounter: Secondary | ICD-10-CM | POA: Diagnosis not present

## 2018-12-06 DIAGNOSIS — S68022A Partial traumatic metacarpophalangeal amputation of left thumb, initial encounter: Secondary | ICD-10-CM | POA: Diagnosis not present

## 2018-12-12 DIAGNOSIS — S68012A Complete traumatic metacarpophalangeal amputation of left thumb, initial encounter: Secondary | ICD-10-CM | POA: Diagnosis not present

## 2018-12-19 DIAGNOSIS — S68012A Complete traumatic metacarpophalangeal amputation of left thumb, initial encounter: Secondary | ICD-10-CM | POA: Diagnosis not present

## 2018-12-25 ENCOUNTER — Other Ambulatory Visit: Payer: Self-pay

## 2018-12-25 ENCOUNTER — Ambulatory Visit
Admission: RE | Admit: 2018-12-25 | Discharge: 2018-12-25 | Disposition: A | Discharge status: Home / Self Care | Payer: PRIVATE HEALTH INSURANCE | Source: Ambulatory Visit | Attending: Neurology | Admitting: Neurology

## 2018-12-25 DIAGNOSIS — Z135 Encounter for screening for eye and ear disorders: Secondary | ICD-10-CM | POA: Diagnosis not present

## 2018-12-25 DIAGNOSIS — T1590XA Foreign body on external eye, part unspecified, unspecified eye, initial encounter: Secondary | ICD-10-CM

## 2018-12-25 DIAGNOSIS — R413 Other amnesia: Secondary | ICD-10-CM | POA: Diagnosis not present

## 2018-12-27 ENCOUNTER — Telehealth: Payer: Self-pay | Admitting: Neurology

## 2018-12-27 NOTE — Telephone Encounter (Signed)
Please call patient, MRI of the brain showed no acute abnormality, there is evidence of moderate small vessel disease. There was no acute abnormalities.   IMPRESSION:   MRI brain (without) demonstrating: - Moderate chronic small vessel ischemic disease. - No acute findings. - Incidental left posterior neck subcutaneous lesion (1.4cm).    IMPRESSION:   MRI brain (without) demonstrating: - Moderate chronic small vessel ischemic disease. - No acute findings. - Incidental left posterior neck subcutaneous lesion (1.4cm).

## 2018-12-27 NOTE — Telephone Encounter (Signed)
I have spoken with the patient and he verbalized understanding of his results.

## 2019-01-15 DIAGNOSIS — L570 Actinic keratosis: Secondary | ICD-10-CM | POA: Diagnosis not present

## 2019-01-15 DIAGNOSIS — E1165 Type 2 diabetes mellitus with hyperglycemia: Secondary | ICD-10-CM | POA: Diagnosis not present

## 2019-01-15 DIAGNOSIS — D1801 Hemangioma of skin and subcutaneous tissue: Secondary | ICD-10-CM | POA: Diagnosis not present

## 2019-01-15 DIAGNOSIS — L578 Other skin changes due to chronic exposure to nonionizing radiation: Secondary | ICD-10-CM | POA: Diagnosis not present

## 2019-01-15 DIAGNOSIS — Z85828 Personal history of other malignant neoplasm of skin: Secondary | ICD-10-CM | POA: Diagnosis not present

## 2019-01-15 DIAGNOSIS — I781 Nevus, non-neoplastic: Secondary | ICD-10-CM | POA: Diagnosis not present

## 2019-01-15 DIAGNOSIS — L720 Epidermal cyst: Secondary | ICD-10-CM | POA: Diagnosis not present

## 2019-01-23 DIAGNOSIS — E119 Type 2 diabetes mellitus without complications: Secondary | ICD-10-CM | POA: Diagnosis not present

## 2019-01-23 DIAGNOSIS — H524 Presbyopia: Secondary | ICD-10-CM | POA: Diagnosis not present

## 2019-01-24 DIAGNOSIS — I10 Essential (primary) hypertension: Secondary | ICD-10-CM | POA: Diagnosis not present

## 2019-01-24 DIAGNOSIS — Z6833 Body mass index (BMI) 33.0-33.9, adult: Secondary | ICD-10-CM | POA: Diagnosis not present

## 2019-01-24 DIAGNOSIS — E1143 Type 2 diabetes mellitus with diabetic autonomic (poly)neuropathy: Secondary | ICD-10-CM | POA: Diagnosis not present

## 2019-01-24 DIAGNOSIS — E7800 Pure hypercholesterolemia, unspecified: Secondary | ICD-10-CM | POA: Diagnosis not present

## 2019-01-24 DIAGNOSIS — Z0000 Encounter for general adult medical examination without abnormal findings: Secondary | ICD-10-CM | POA: Diagnosis not present

## 2019-02-12 DIAGNOSIS — S68012D Complete traumatic metacarpophalangeal amputation of left thumb, subsequent encounter: Secondary | ICD-10-CM | POA: Diagnosis not present

## 2019-05-20 DIAGNOSIS — E1165 Type 2 diabetes mellitus with hyperglycemia: Secondary | ICD-10-CM | POA: Diagnosis not present

## 2019-05-27 DIAGNOSIS — Z6834 Body mass index (BMI) 34.0-34.9, adult: Secondary | ICD-10-CM | POA: Diagnosis not present

## 2019-05-27 DIAGNOSIS — E1165 Type 2 diabetes mellitus with hyperglycemia: Secondary | ICD-10-CM | POA: Diagnosis not present

## 2019-05-27 DIAGNOSIS — E7800 Pure hypercholesterolemia, unspecified: Secondary | ICD-10-CM | POA: Diagnosis not present

## 2019-05-27 DIAGNOSIS — I10 Essential (primary) hypertension: Secondary | ICD-10-CM | POA: Diagnosis not present

## 2019-06-11 DIAGNOSIS — E119 Type 2 diabetes mellitus without complications: Secondary | ICD-10-CM

## 2019-06-11 DIAGNOSIS — L600 Ingrowing nail: Secondary | ICD-10-CM | POA: Diagnosis not present

## 2019-06-11 HISTORY — DX: Type 2 diabetes mellitus without complications: E11.9

## 2019-07-25 DIAGNOSIS — L570 Actinic keratosis: Secondary | ICD-10-CM | POA: Diagnosis not present

## 2019-07-25 DIAGNOSIS — L578 Other skin changes due to chronic exposure to nonionizing radiation: Secondary | ICD-10-CM | POA: Diagnosis not present

## 2019-07-25 DIAGNOSIS — I781 Nevus, non-neoplastic: Secondary | ICD-10-CM | POA: Diagnosis not present

## 2019-07-25 DIAGNOSIS — N481 Balanitis: Secondary | ICD-10-CM | POA: Diagnosis not present

## 2019-07-25 DIAGNOSIS — D1801 Hemangioma of skin and subcutaneous tissue: Secondary | ICD-10-CM | POA: Diagnosis not present

## 2019-07-25 DIAGNOSIS — Z85828 Personal history of other malignant neoplasm of skin: Secondary | ICD-10-CM | POA: Diagnosis not present

## 2019-07-25 DIAGNOSIS — Z08 Encounter for follow-up examination after completed treatment for malignant neoplasm: Secondary | ICD-10-CM | POA: Diagnosis not present

## 2019-09-17 DIAGNOSIS — E1165 Type 2 diabetes mellitus with hyperglycemia: Secondary | ICD-10-CM | POA: Diagnosis not present

## 2019-09-24 DIAGNOSIS — Z9181 History of falling: Secondary | ICD-10-CM | POA: Diagnosis not present

## 2019-09-24 DIAGNOSIS — Z1331 Encounter for screening for depression: Secondary | ICD-10-CM | POA: Diagnosis not present

## 2019-09-24 DIAGNOSIS — Z6832 Body mass index (BMI) 32.0-32.9, adult: Secondary | ICD-10-CM | POA: Diagnosis not present

## 2019-09-24 DIAGNOSIS — E1165 Type 2 diabetes mellitus with hyperglycemia: Secondary | ICD-10-CM | POA: Diagnosis not present

## 2019-11-27 DIAGNOSIS — Z0000 Encounter for general adult medical examination without abnormal findings: Secondary | ICD-10-CM | POA: Diagnosis not present

## 2019-11-27 DIAGNOSIS — Z79899 Other long term (current) drug therapy: Secondary | ICD-10-CM | POA: Diagnosis not present

## 2019-11-27 DIAGNOSIS — E7800 Pure hypercholesterolemia, unspecified: Secondary | ICD-10-CM | POA: Diagnosis not present

## 2019-11-27 DIAGNOSIS — E1165 Type 2 diabetes mellitus with hyperglycemia: Secondary | ICD-10-CM | POA: Diagnosis not present

## 2019-11-27 DIAGNOSIS — Z6832 Body mass index (BMI) 32.0-32.9, adult: Secondary | ICD-10-CM | POA: Diagnosis not present

## 2019-12-04 ENCOUNTER — Encounter (INDEPENDENT_AMBULATORY_CARE_PROVIDER_SITE_OTHER): Payer: Self-pay | Admitting: Otolaryngology

## 2019-12-04 ENCOUNTER — Ambulatory Visit (INDEPENDENT_AMBULATORY_CARE_PROVIDER_SITE_OTHER): Payer: PRIVATE HEALTH INSURANCE | Admitting: Otolaryngology

## 2019-12-04 ENCOUNTER — Other Ambulatory Visit: Payer: Self-pay

## 2019-12-04 VITALS — Temp 97.3°F

## 2019-12-04 DIAGNOSIS — L720 Epidermal cyst: Secondary | ICD-10-CM | POA: Diagnosis not present

## 2019-12-04 NOTE — Progress Notes (Signed)
HPI: Jacob Patel is a 79 y.o. male who presents is referred by his PCP Dr. Lin Landsman for evaluation of posterior left neck nodule.  Patient states that he has had this for 10 to 15 years and has gradually gotten a little larger.  It is not painful and has never swollen acutely or caused discomfort.  There was a question about whether this represents an enlarged lymph node versus a cyst.  Past Medical History:  Diagnosis Date  . Anxiety state   . 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 Benign essential hypertension   . Disease of esophagus, unspecified   . OSA on CPAP   . Osteoarthritis   . Pure hypercholesterolemia    Past Surgical History:  Procedure Laterality Date  . CARPAL TUNNEL RELEASE Right   . KNEE ARTHROSCOPY Left    Social History   Socioeconomic History  . Marital status: Married    Spouse name: Not on file  . Number of children: Not on file  . Years of education: Not on file  . Highest education level: Not on file  Occupational History  . Not on file  Tobacco Use  . Smoking status: Never Smoker  . Smokeless tobacco: Never Used  Substance and Sexual Activity  . Alcohol use: Never  . Drug use: Never  . Sexual activity: Not on file  Other Topics Concern  . Not on file  Social History Narrative   Caffeine use: mild consumption   Has POA   Social Determinants of Health   Financial Resource Strain:   . Difficulty of Paying Living Expenses:   Food Insecurity:   . Worried About Charity fundraiser in the Last Year:   . Arboriculturist in the Last Year:   Transportation Needs:   . Film/video editor (Medical):   Marland Kitchen Lack of Transportation (Non-Medical):   Physical Activity:   . Days of Exercise per Week:   . Minutes of Exercise per Session:   Stress:   . Feeling of Stress :   Social Connections:   . Frequency of Communication with Friends and Family:   . Frequency of Social Gatherings with Friends and Family:   . Attends Religious Services:    . Active Member of Clubs or Organizations:   . Attends Archivist Meetings:   Marland Kitchen Marital Status:    Family History  Problem Relation Age of Onset  . CAD Mother   . CAD Father   . CAD Brother    No Known Allergies Prior to Admission medications   Medication Sig Start Date End Date Taking? Authorizing Provider  amLODipine (NORVASC) 5 MG tablet Take 5 mg by mouth daily.   Yes [provider]  ASPIRIN 81 PO Take 1 tablet by mouth daily. Childrens-chewable   Yes [provider]  carvedilol (COREG) 6.25 MG tablet Take 6.25 mg by mouth 2 (two) times daily with a meal.   Yes [provider]  Dulaglutide (TRULICITY) 6.31 SH/7.78YO SOPN Inject 1 Dose into the skin once a week.   Yes [provider]  fexofenadine (ALLEGRA) 180 MG tablet Take 180 mg by mouth daily.   Yes [provider]  fluticasone (FLONASE) 50 MCG/ACT nasal spray Place into both nostrils daily.   Yes [provider]  lansoprazole (PREVACID) 30 MG capsule Take 30 mg by mouth daily at 12 noon.   Yes [provider]  lisinopril (ZESTRIL) 10 MG tablet Take 10 mg by  mouth daily.   Yes [provider]  metFORMIN (GLUCOPHAGE) 1000 MG tablet Take 1,000 mg by mouth 2 (two) times daily with a meal.   Yes [provider]  rosuvastatin (CRESTOR) 40 MG tablet Take 40 mg by mouth daily.   Yes [provider]     Positive ROS: Otherwise negative  All other systems have been reviewed and were otherwise negative with the exception of those mentioned in the HPI and as above.  Physical Exam: Constitutional: Alert, well-appearing, no acute distress Ears: External ears without lesions or tenderness. Ear canals are clear bilaterally with intact, clear TMs.  Nasal: External nose without lesions. Septum midline. Clear nasal passages bilaterally. Oral: Lips and gums without lesions. Tongue and palate mucosa without lesions. Posterior oropharynx  clear. Neck: On examination of the neck patient has a nodule posterior left neck just superficial to the trapezius muscle within the subcutaneous tissue consistent with probable cyst.  Measures approximately 1.5 to 2 cm in size.  No adenopathy noted otherwise.  On examination of the anterior neck no adenopathy within the neck.  No postauricular adenopathy and no parotid masses noted. Respiratory: Breathing comfortably  Skin: No facial/neck lesions or rash noted.  Procedures  Assessment: Findings are consistent with probable subcutaneous cyst.  This is not appear to represent a neoplasm or enlarged lymph node.  Plan: Did not recommend any specific therapy concerning this unless it is bothersome to him or if it enlarges significantly.   Radene Journey, MD   CC:

## 2019-12-10 DIAGNOSIS — E119 Type 2 diabetes mellitus without complications: Secondary | ICD-10-CM | POA: Diagnosis not present

## 2019-12-10 DIAGNOSIS — L600 Ingrowing nail: Secondary | ICD-10-CM | POA: Diagnosis not present

## 2020-01-23 DIAGNOSIS — D1801 Hemangioma of skin and subcutaneous tissue: Secondary | ICD-10-CM | POA: Diagnosis not present

## 2020-01-23 DIAGNOSIS — L814 Other melanin hyperpigmentation: Secondary | ICD-10-CM | POA: Diagnosis not present

## 2020-01-23 DIAGNOSIS — X32XXXS Exposure to sunlight, sequela: Secondary | ICD-10-CM | POA: Diagnosis not present

## 2020-01-23 DIAGNOSIS — L821 Other seborrheic keratosis: Secondary | ICD-10-CM | POA: Diagnosis not present

## 2020-01-23 DIAGNOSIS — L570 Actinic keratosis: Secondary | ICD-10-CM | POA: Diagnosis not present

## 2020-01-23 DIAGNOSIS — Z85828 Personal history of other malignant neoplasm of skin: Secondary | ICD-10-CM | POA: Diagnosis not present

## 2020-01-23 DIAGNOSIS — L608 Other nail disorders: Secondary | ICD-10-CM | POA: Diagnosis not present

## 2020-01-29 DIAGNOSIS — E119 Type 2 diabetes mellitus without complications: Secondary | ICD-10-CM | POA: Diagnosis not present

## 2020-01-29 DIAGNOSIS — H25813 Combined forms of age-related cataract, bilateral: Secondary | ICD-10-CM | POA: Diagnosis not present

## 2020-06-11 DIAGNOSIS — E119 Type 2 diabetes mellitus without complications: Secondary | ICD-10-CM | POA: Diagnosis not present

## 2020-06-11 DIAGNOSIS — L600 Ingrowing nail: Secondary | ICD-10-CM | POA: Diagnosis not present

## 2020-07-03 DIAGNOSIS — E7800 Pure hypercholesterolemia, unspecified: Secondary | ICD-10-CM | POA: Diagnosis not present

## 2020-07-03 DIAGNOSIS — Z6832 Body mass index (BMI) 32.0-32.9, adult: Secondary | ICD-10-CM | POA: Diagnosis not present

## 2020-07-03 DIAGNOSIS — E1143 Type 2 diabetes mellitus with diabetic autonomic (poly)neuropathy: Secondary | ICD-10-CM | POA: Diagnosis not present

## 2020-07-03 DIAGNOSIS — I10 Essential (primary) hypertension: Secondary | ICD-10-CM | POA: Diagnosis not present

## 2020-07-03 DIAGNOSIS — Z0000 Encounter for general adult medical examination without abnormal findings: Secondary | ICD-10-CM | POA: Diagnosis not present

## 2020-07-28 IMAGING — CR ORBITS FOR FOREIGN BODY - 2 VIEW
2 series · 2 of 2 positions shown · non-contrast
Comparison: None.

CLINICAL DATA: Metal working/exposure; clearance prior to MRI

EXAM:
ORBITS FOR FOREIGN BODY - 2 VIEW

[w orbit pa (1 of 2)]
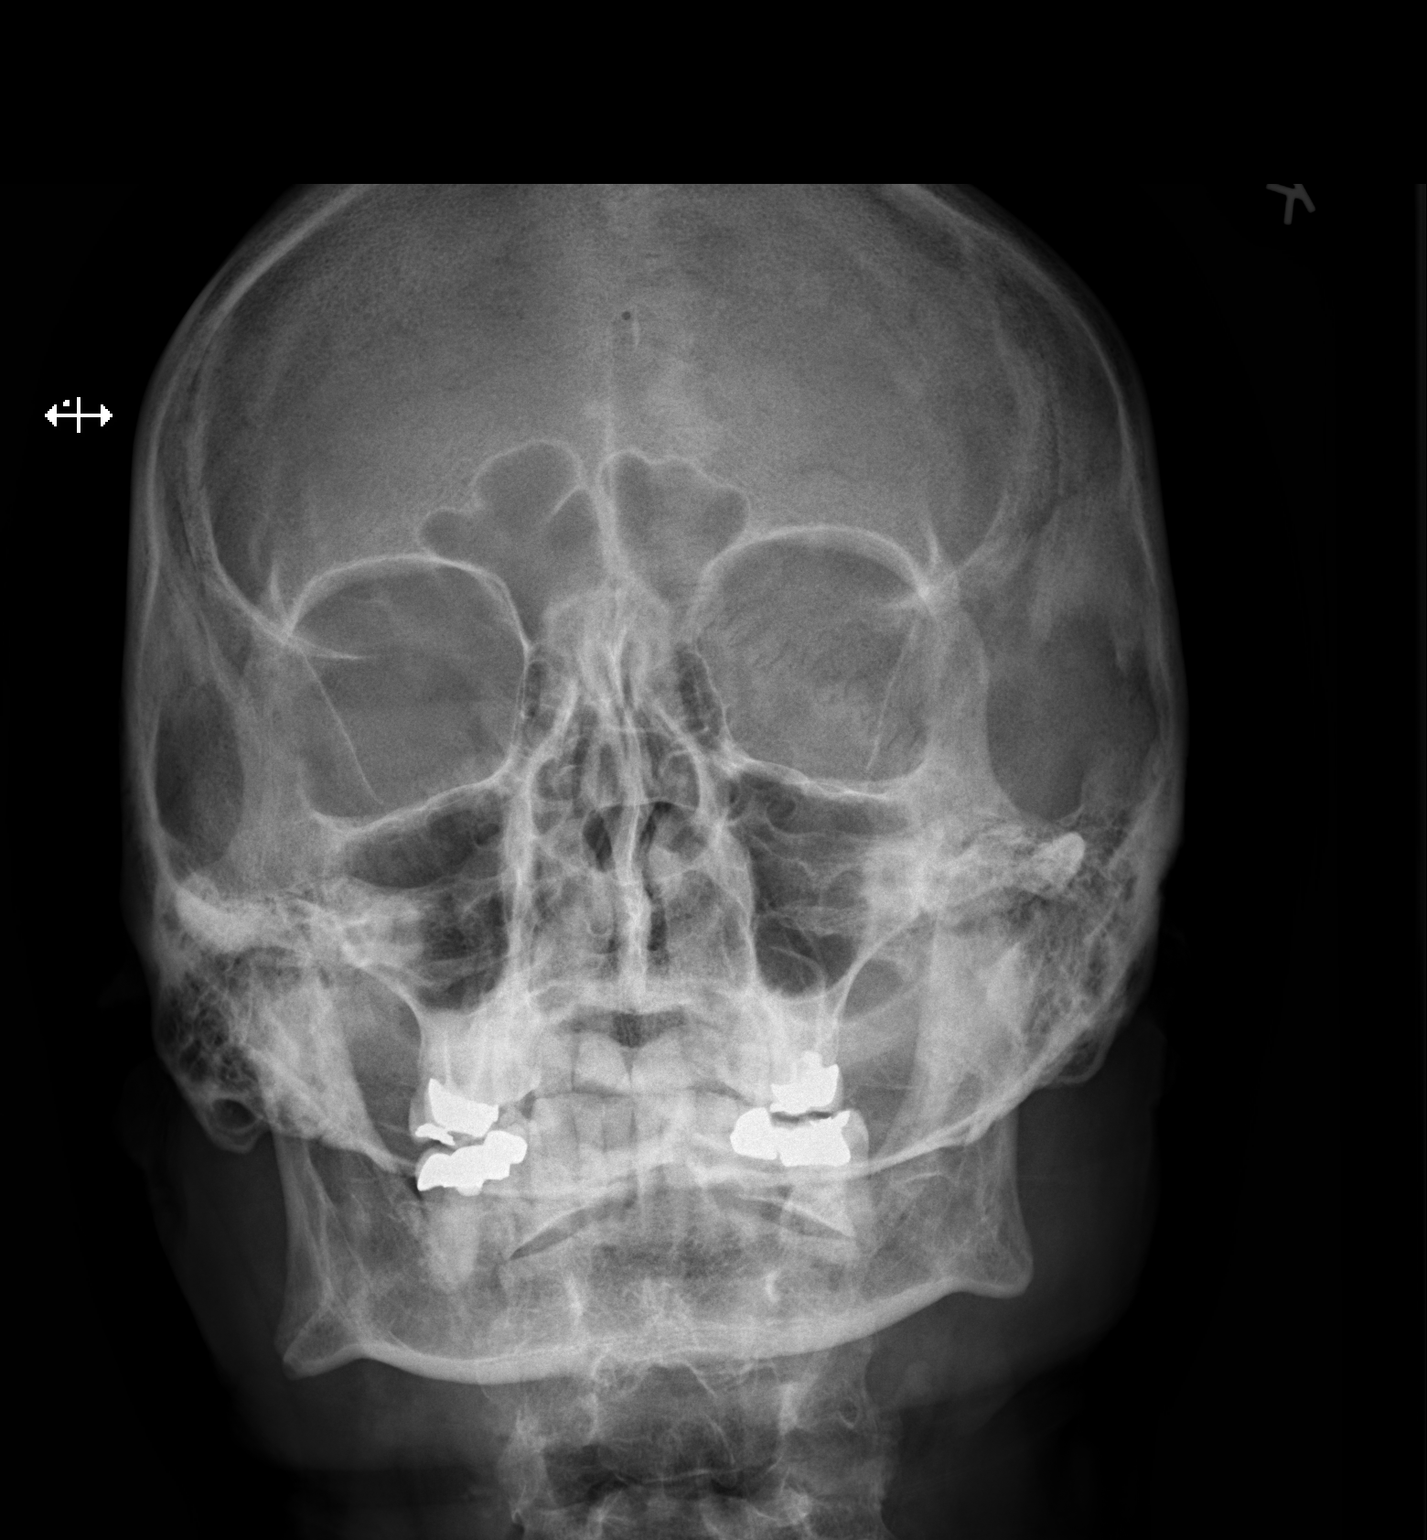

[w orbit pa (2 of 2)]
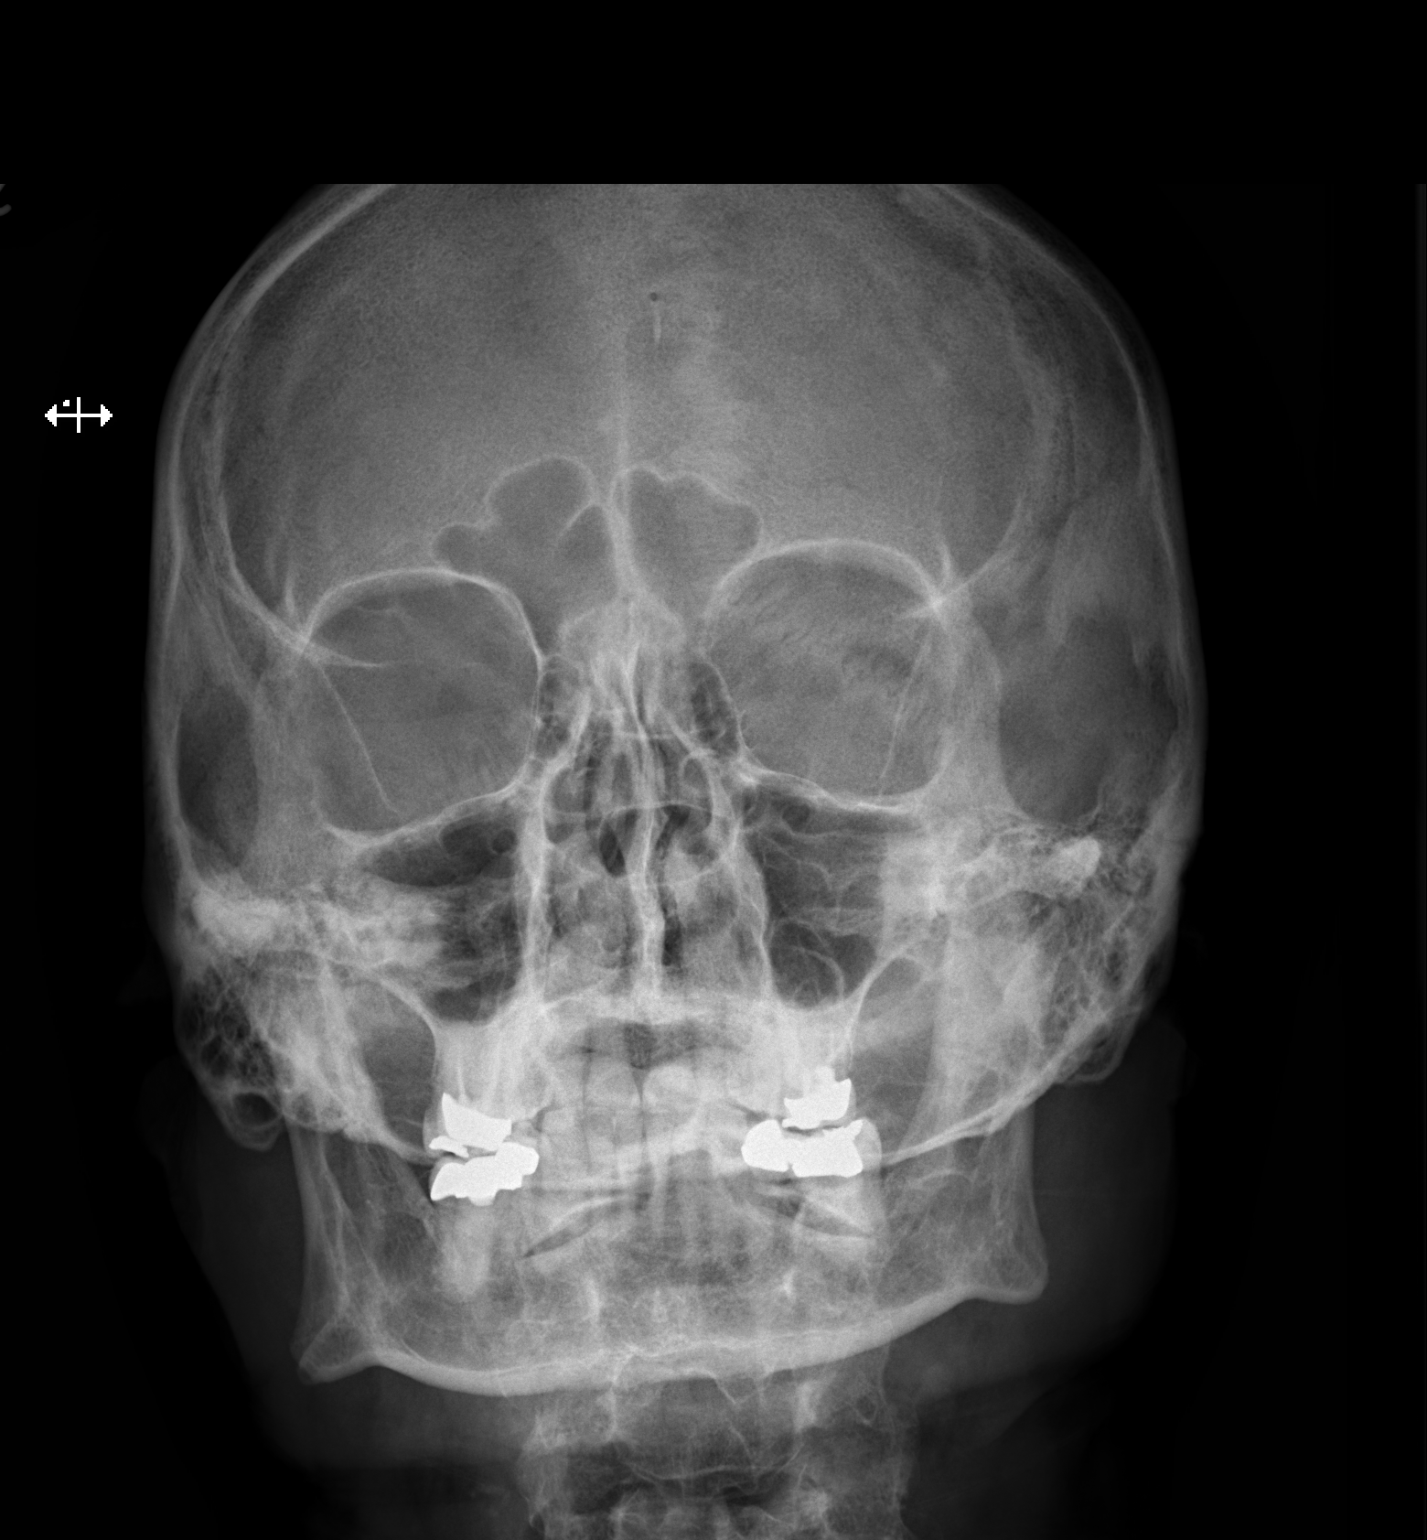

[2 of 2 positions shown; findings below may reference images not displayed]

FINDINGS: There is no evidence of metallic foreign body within the orbits. No
significant bone abnormality identified.
IMPRESSION: No evidence of metallic foreign body within the orbits.

## 2020-09-09 DIAGNOSIS — I10 Essential (primary) hypertension: Secondary | ICD-10-CM | POA: Diagnosis not present

## 2020-09-09 DIAGNOSIS — E7800 Pure hypercholesterolemia, unspecified: Secondary | ICD-10-CM | POA: Diagnosis not present

## 2020-09-09 DIAGNOSIS — E1143 Type 2 diabetes mellitus with diabetic autonomic (poly)neuropathy: Secondary | ICD-10-CM | POA: Diagnosis not present

## 2020-09-15 DIAGNOSIS — D1801 Hemangioma of skin and subcutaneous tissue: Secondary | ICD-10-CM | POA: Diagnosis not present

## 2020-09-15 DIAGNOSIS — L821 Other seborrheic keratosis: Secondary | ICD-10-CM | POA: Diagnosis not present

## 2020-09-15 DIAGNOSIS — L578 Other skin changes due to chronic exposure to nonionizing radiation: Secondary | ICD-10-CM | POA: Diagnosis not present

## 2020-09-15 DIAGNOSIS — Z85828 Personal history of other malignant neoplasm of skin: Secondary | ICD-10-CM | POA: Diagnosis not present

## 2020-09-15 DIAGNOSIS — L570 Actinic keratosis: Secondary | ICD-10-CM | POA: Diagnosis not present

## 2020-09-15 DIAGNOSIS — L720 Epidermal cyst: Secondary | ICD-10-CM | POA: Diagnosis not present

## 2020-10-10 DIAGNOSIS — I10 Essential (primary) hypertension: Secondary | ICD-10-CM | POA: Diagnosis not present

## 2020-10-10 DIAGNOSIS — E1143 Type 2 diabetes mellitus with diabetic autonomic (poly)neuropathy: Secondary | ICD-10-CM | POA: Diagnosis not present

## 2020-10-10 DIAGNOSIS — E7800 Pure hypercholesterolemia, unspecified: Secondary | ICD-10-CM | POA: Diagnosis not present

## 2020-11-23 DIAGNOSIS — D124 Benign neoplasm of descending colon: Secondary | ICD-10-CM | POA: Diagnosis not present

## 2020-11-23 DIAGNOSIS — Z86010 Personal history of colonic polyps: Secondary | ICD-10-CM | POA: Diagnosis not present

## 2020-12-10 DIAGNOSIS — E119 Type 2 diabetes mellitus without complications: Secondary | ICD-10-CM | POA: Diagnosis not present

## 2020-12-10 DIAGNOSIS — L600 Ingrowing nail: Secondary | ICD-10-CM | POA: Diagnosis not present

## 2021-01-14 DIAGNOSIS — Z0000 Encounter for general adult medical examination without abnormal findings: Secondary | ICD-10-CM | POA: Diagnosis not present

## 2021-01-14 DIAGNOSIS — Z1331 Encounter for screening for depression: Secondary | ICD-10-CM | POA: Diagnosis not present

## 2021-01-14 DIAGNOSIS — Z9181 History of falling: Secondary | ICD-10-CM | POA: Diagnosis not present

## 2021-01-14 DIAGNOSIS — E7800 Pure hypercholesterolemia, unspecified: Secondary | ICD-10-CM | POA: Diagnosis not present

## 2021-01-14 DIAGNOSIS — Z6832 Body mass index (BMI) 32.0-32.9, adult: Secondary | ICD-10-CM | POA: Diagnosis not present

## 2021-01-14 DIAGNOSIS — Z79899 Other long term (current) drug therapy: Secondary | ICD-10-CM | POA: Diagnosis not present

## 2021-01-14 DIAGNOSIS — E1143 Type 2 diabetes mellitus with diabetic autonomic (poly)neuropathy: Secondary | ICD-10-CM | POA: Diagnosis not present

## 2021-02-03 DIAGNOSIS — E119 Type 2 diabetes mellitus without complications: Secondary | ICD-10-CM | POA: Diagnosis not present

## 2021-02-12 ENCOUNTER — Encounter: Payer: Self-pay | Admitting: Cardiology

## 2021-02-12 ENCOUNTER — Encounter: Payer: Self-pay | Admitting: *Deleted

## 2021-03-18 DIAGNOSIS — Z85828 Personal history of other malignant neoplasm of skin: Secondary | ICD-10-CM | POA: Diagnosis not present

## 2021-03-18 DIAGNOSIS — L720 Epidermal cyst: Secondary | ICD-10-CM | POA: Diagnosis not present

## 2021-03-18 DIAGNOSIS — L821 Other seborrheic keratosis: Secondary | ICD-10-CM | POA: Diagnosis not present

## 2021-03-18 DIAGNOSIS — D1801 Hemangioma of skin and subcutaneous tissue: Secondary | ICD-10-CM | POA: Diagnosis not present

## 2021-03-18 DIAGNOSIS — L578 Other skin changes due to chronic exposure to nonionizing radiation: Secondary | ICD-10-CM | POA: Diagnosis not present

## 2021-03-18 DIAGNOSIS — I781 Nevus, non-neoplastic: Secondary | ICD-10-CM | POA: Diagnosis not present

## 2021-03-18 DIAGNOSIS — L570 Actinic keratosis: Secondary | ICD-10-CM | POA: Diagnosis not present

## 2021-03-21 ENCOUNTER — Encounter: Payer: Self-pay | Admitting: Cardiology

## 2021-03-21 NOTE — Progress Notes (Signed)
Cardiology Office Note:    Date:  03/22/2021   ID:  Jacob Patel, DOB 1941/04/29, MRN [id number]  PCP:  Angelina Sheriff, MD  Cardiologist:  Shirlee More, MD   Referring MD: Angelina Sheriff, MD  ASSESSMENT:    1. Cardiac risk counseling   2. Benign essential hypertension   3. Pure hypercholesterolemia   4. Type 2 diabetes mellitus without complication, without long-term current use of insulin (HCC)    PLAN:    In order of problems listed above:  I reviewed Mr. Langenderfer that I think he has had exceptional medical care good antihypertensive control treated with a high intensity statin and appropriate cardioprotective diabetic medications.  In his age group with diabetes hypertension hyperlipidemia he is at a high risk group all of these are indicated.  In individuals at intermediate risk for there is a question of whether statin is beneficial cardiac coronary calcium score is helpful to reassess the risk and guide the need for statin therapy.  In individuals who are having chest pain syndromes cardiac CTA is appropriate for documentation of CAD assessment of severity and guide whether effusion is treated optimally with medications or revascularization.  At this time he is a high risk group on appropriate medications and I would do a calcium score as an think it would change his treatment.  He is not having a chest pain syndrome and I would not advise cardiac CTA at this time.  He agrees with me. Stable well-controlled at target continue current multidrug regimen Continue high intensity statin Well-controlled A1c at target on appropriate cardioprotective medication metformin GLP-1 agonist and SGLT2 antagonist  Next appointment as needed   Medication Adjustments/Labs and Tests Ordered: Current medicines are reviewed at length with the patient today.  Concerns regarding medicines are outlined above.  Orders Placed This Encounter  Procedures   EKG 12-Lead   No orders of the  defined types were placed in this encounter.   Chief complaint, what is my heart risk   History of Present Illness:    Jacob Patel is a 80 y.o. male who is being seen today for the evaluation of cardiovascular risk at the request of [name], [name], MD.  16 to 50 years ago he was admitted to the hospital for 3 days with indigestion was told he did not have heart disease He has no known history of congenital rheumatic heart disease CAD or atrial fibrillation With his diabetes and unsteadiness in his gait he has become sedentary he short of breath when he walks a long distance but not severe or limiting no orthopnea edema chest pain palpitation or syncope. He is on a good medical regimen including antihypertensive control beta-blocker ACE inhibitor calcium channel blocker diabetic treatment GLP-1 agent and SGLT2 inhibitor along with metformin and a high intensity statin.  Recent labs 01/14/2021 show cholesterol 127 HDL 31 triglycerides 242 A1c 7.0% hemoglobin 12.2 and mild kidney dysfunction creatinine 1.3  He has multiple cardiovascular risk including advanced age type 2 diabetes hypertension hyperlipidemia and was advised cardiac CT calcium score.  Chart review shows no previous cardiology evaluation or cardiac diagnostic imaging.  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   carvedilol (COREG) 6.25 MG tablet Take 6.25 mg by mouth 2 (two) times daily with a meal.   Cholecalciferol (VITAMIN D) 50 MCG (2000 UT) CAPS Take 2,000 Units by mouth daily.   Dulaglutide (TRULICITY) 1.5 EX/5.1ZG SOPN Inject 0.5 mLs into the skin once a week.   empagliflozin (JARDIANCE) 25 MG TABS tablet Take 25 mg by mouth daily.   fexofenadine (ALLEGRA) 180 MG tablet Take 180 mg by mouth daily.   lansoprazole (PREVACID) 30 MG capsule Take 30 mg by mouth daily at 12 noon.   lisinopril (ZESTRIL) 10 MG tablet Take 10 mg by mouth daily.   metFORMIN (GLUCOPHAGE) 1000 MG tablet Take 1,000 mg by mouth 2 (two) times daily with a meal.   Niacinamide-Zn-Cu-Methfo-Se-Cr (NICOTINAMIDE PO) Take 1 tablet by mouth 2 (two) times daily. Strength unknown   rosuvastatin (CRESTOR) 40 MG tablet Take 40 mg by mouth at bedtime.     Allergies:   Patient has no known allergies.   Social History   Socioeconomic History   Marital status: Married    Spouse name: Not on file   Number of children: Not on file   Years of education: Not on file   Highest education level: Not on file  Occupational History   Not on file  Tobacco Use   Smoking status: Never   Smokeless tobacco: Never  Vaping Use   Vaping Use: Never used  Substance and Sexual Activity   Alcohol use: Never   Drug use: Never   Sexual activity: Not on file  Other Topics Concern   Not on file  Social History Narrative   Caffeine use: mild consumption   Has POA   Social Determinants of Health   Financial Resource Strain: Not on file  Food Insecurity: Not on file  Transportation Needs: Not on file  Physical Activity: Not on file  Stress: Not on file  Social Connections: Not on file     Family History: The patient's  family history includes CAD in his brother, father, and mother; Congestive Heart Failure (age of onset: 57) in his father; Coronary artery disease in his brother; GI problems in his brother and father; Heart Problems in his mother; Valvular heart disease in his brother.  ROS:   ROS Please see the history of present illness.     All other systems reviewed and are negative.  EKGs/Labs/Other Studies Reviewed:    The following studies were reviewed today:   EKG:  EKG is  ordered today.  The ekg ordered today is personally reviewed and demonstrates sinus rhythm normal    Physical Exam:    VS:  BP 120/72 (BP Location: Right Arm, Patient Position: Sitting)   Pulse 84   Ht 5\' 11"  (1.803 m)   Wt 232 lb 12.8 oz (105.6 kg)   SpO2 95%  BMI 32.47 kg/m     Wt Readings from Last 3 Encounters:  03/22/21 232 lb 12.8 oz (105.6 kg)  01/14/21 233 lb (105.7 kg)     GEN: Overweight well nourished, well developed in no acute distress HEENT: Normal NECK: No JVD; No carotid bruits LYMPHATICS: No lymphadenopathy CARDIAC: RRR, no murmurs, rubs, gallops RESPIRATORY:  Clear to auscultation without rales, wheezing or rhonchi  ABDOMEN: Soft, non-tender, non-distended MUSCULOSKELETAL:  No edema; No deformity  SKIN: Warm and dry NEUROLOGIC:  Alert and oriented x 3 PSYCHIATRIC:  Normal affect     Signed, Shirlee More, MD  03/22/2021 4:12 PM    [facility]

## 2021-03-22 ENCOUNTER — Ambulatory Visit (INDEPENDENT_AMBULATORY_CARE_PROVIDER_SITE_OTHER): Payer: PRIVATE HEALTH INSURANCE | Admitting: Cardiology

## 2021-03-22 ENCOUNTER — Other Ambulatory Visit: Payer: Self-pay

## 2021-03-22 ENCOUNTER — Encounter: Payer: Self-pay | Admitting: Cardiology

## 2021-03-22 VITALS — BP 120/72 | HR 84 | Ht 71.0 in | Wt 232.8 lb

## 2021-03-22 DIAGNOSIS — I10 Essential (primary) hypertension: Secondary | ICD-10-CM | POA: Diagnosis not present

## 2021-03-22 DIAGNOSIS — E119 Type 2 diabetes mellitus without complications: Secondary | ICD-10-CM

## 2021-03-22 DIAGNOSIS — K229 Disease of esophagus, unspecified: Secondary | ICD-10-CM | POA: Insufficient documentation

## 2021-03-22 DIAGNOSIS — E7800 Pure hypercholesterolemia, unspecified: Secondary | ICD-10-CM

## 2021-03-22 DIAGNOSIS — G4733 Obstructive sleep apnea (adult) (pediatric): Secondary | ICD-10-CM | POA: Insufficient documentation

## 2021-03-22 DIAGNOSIS — Z7189 Other specified counseling: Secondary | ICD-10-CM

## 2021-03-22 DIAGNOSIS — E1143 Type 2 diabetes mellitus with diabetic autonomic (poly)neuropathy: Secondary | ICD-10-CM

## 2021-03-22 DIAGNOSIS — Z9989 Dependence on other enabling machines and devices: Secondary | ICD-10-CM | POA: Insufficient documentation

## 2021-03-22 DIAGNOSIS — F419 Anxiety disorder, unspecified: Secondary | ICD-10-CM

## 2021-03-22 DIAGNOSIS — M1990 Unspecified osteoarthritis, unspecified site: Secondary | ICD-10-CM | POA: Insufficient documentation

## 2021-03-22 DIAGNOSIS — F411 Generalized anxiety disorder: Secondary | ICD-10-CM | POA: Insufficient documentation

## 2021-03-22 HISTORY — DX: Obstructive sleep apnea (adult) (pediatric): G47.33

## 2021-03-22 HISTORY — DX: Type 2 diabetes mellitus with diabetic autonomic (poly)neuropathy: E11.43

## 2021-03-22 HISTORY — DX: Anxiety disorder, unspecified: F41.9

## 2021-03-22 NOTE — Patient Instructions (Signed)

## 2021-06-15 DIAGNOSIS — E119 Type 2 diabetes mellitus without complications: Secondary | ICD-10-CM | POA: Diagnosis not present

## 2021-06-15 DIAGNOSIS — L600 Ingrowing nail: Secondary | ICD-10-CM | POA: Diagnosis not present

## 2021-06-17 DIAGNOSIS — E1165 Type 2 diabetes mellitus with hyperglycemia: Secondary | ICD-10-CM | POA: Diagnosis not present

## 2021-06-17 DIAGNOSIS — E7800 Pure hypercholesterolemia, unspecified: Secondary | ICD-10-CM | POA: Diagnosis not present

## 2021-06-17 DIAGNOSIS — I10 Essential (primary) hypertension: Secondary | ICD-10-CM | POA: Diagnosis not present

## 2021-06-17 DIAGNOSIS — Z6832 Body mass index (BMI) 32.0-32.9, adult: Secondary | ICD-10-CM | POA: Diagnosis not present

## 2021-07-06 ENCOUNTER — Telehealth: Payer: Self-pay | Admitting: Oncology

## 2021-07-06 NOTE — Telephone Encounter (Signed)
Patient has been scheduled for NP appt. Aware of appt details and location

## 2021-07-15 ENCOUNTER — Other Ambulatory Visit: Payer: Self-pay | Admitting: Oncology

## 2021-07-15 DIAGNOSIS — D649 Anemia, unspecified: Secondary | ICD-10-CM

## 2021-07-15 NOTE — Progress Notes (Signed)
[place]  [address] [place],  [place]  [address] [phone]  Clinic Day:  07/16/2021  Referring physician: Angelina Sheriff, MD   HISTORY OF PRESENT ILLNESS:  The patient is a 80 y.o. male who I was asked to consult upon for anemia.  Recent labs showed a mildly low hemoglobin of 12.2.  The patient denies having any overt forms of blood loss to explain his mild anemia.  He recalls having a colonoscopy 3 years ago, for which a few polyps were removed.  He denies being placed on any new medications which have the ability to cause anemia via bone marrow suppression.  He claims his mother also had anemia for which she required IV iron treatments.  Overall, the patient has been doing well.  He denies having any particular changes in his health over these past months.  Of note, this patient was last seen by me in 2009.  His hemoglobin at his last visit with me at that time was 13.5.  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 [facility])    Benign essential hypertension    Disease of esophagus, unspecified    Ingrown nail 01/04/2018   Leg edema, right 05/15/2018   Memory loss 11/22/2018   OSA on CPAP    Osteoarthritis    Pure hypercholesterolemia    Type 2 diabetes mellitus (HCC)     PAST SURGICAL HISTORY:   Past Surgical History:  Procedure Laterality Date   CARPAL TUNNEL RELEASE Right    KNEE ARTHROSCOPY Left    ROTATOR CUFF REPAIR Left     CURRENT MEDICATIONS:   Current Outpatient Medications  Medication Sig Dispense Refill   amLODipine (NORVASC) 5 MG tablet Take 5 mg by mouth daily.     carvedilol (COREG) 6.25 MG tablet Take 6.25 mg by mouth 2 (two) times daily with a meal.     Cholecalciferol (VITAMIN D) 50 MCG (2000 UT) CAPS Take 2,000 Units by mouth daily.     Dulaglutide (TRULICITY) 1.5 NF/6.2ZH SOPN Inject 0.5 mLs into the skin once a week.      empagliflozin (JARDIANCE) 25 MG TABS tablet Take 25 mg by mouth daily.     fexofenadine (ALLEGRA) 180 MG tablet Take 180 mg by mouth daily.     lansoprazole (PREVACID) 30 MG capsule Take 30 mg by mouth daily at 12 noon.     lisinopril (ZESTRIL) 10 MG tablet Take 10 mg by mouth daily.     metFORMIN (GLUCOPHAGE) 1000 MG tablet Take 1,000 mg by mouth 2 (two) times daily with a meal.     Niacinamide-Zn-Cu-Methfo-Se-Cr (NICOTINAMIDE PO) Take 1 tablet by mouth 2 (two) times daily. Strength unknown     rosuvastatin (CRESTOR) 40 MG tablet Take 40 mg by mouth at bedtime.     No current facility-administered medications for this visit.    ALLERGIES:  No Known Allergies  FAMILY HISTORY:   Family History  Problem Relation Age of Onset   CAD Mother    Heart Problems Mother    CAD Father    GI problems Father    Congestive Heart Failure Father 85   CAD Brother    Valvular heart disease Brother        Valve replacement   Coronary artery disease Brother    GI problems Brother     SOCIAL HISTORY:  The patient was born  and raised in [place].  He lives in [place] with his wife for 53 years.  They have 2 children and 8 grandchildren.  He was involved in Art therapist for numerous years.  He also was a Psychologist, sport and exercise.  He denies a history of alcoholism or tobacco abuse.  REVIEW OF SYSTEMS:  Review of Systems  Constitutional:  Negative for fatigue, fever and unexpected weight change.  Eyes:  Positive for eye problems.  Respiratory:  Negative for chest tightness, cough, hemoptysis and shortness of breath.   Cardiovascular:  Negative for chest pain and palpitations.  Gastrointestinal:  Negative for abdominal distention, abdominal pain, blood in stool, constipation, diarrhea, nausea and vomiting.  Genitourinary:  Positive for nocturia. Negative for dysuria, frequency and hematuria.   Musculoskeletal:  Negative for arthralgias, back pain and myalgias.  Skin:  Negative for itching and rash.   Neurological:  Negative for dizziness, headaches and light-headedness.  Psychiatric/Behavioral:  Negative for depression and suicidal ideas. The patient is not nervous/anxious.     PHYSICAL EXAM:  There were no vitals taken for this visit. Wt Readings from Last 3 Encounters:  03/22/21 232 lb 12.8 oz (105.6 kg)  01/14/21 233 lb (105.7 kg)   There is no height or weight on file to calculate BMI. Performance status (ECOG): 0 - Asymptomatic Physical Exam Constitutional:      Appearance: Normal appearance. He is not ill-appearing.  HENT:     Mouth/Throat:     Mouth: Mucous membranes are moist.     Pharynx: Oropharynx is clear. No oropharyngeal exudate or posterior oropharyngeal erythema.  Cardiovascular:     Rate and Rhythm: Normal rate and regular rhythm.     Heart sounds: No murmur heard.   No friction rub. No gallop.  Pulmonary:     Effort: Pulmonary effort is normal. No respiratory distress.     Breath sounds: Normal breath sounds. No wheezing, rhonchi or rales.  Abdominal:     General: Bowel sounds are normal. There is no distension.     Palpations: Abdomen is soft. There is no mass.     Tenderness: There is no abdominal tenderness.  Musculoskeletal:        General: No swelling.     Right lower leg: No edema.     Left lower leg: No edema.  Lymphadenopathy:     Cervical: No cervical adenopathy.     Upper Body:     Right upper body: No supraclavicular or axillary adenopathy.     Left upper body: No supraclavicular or axillary adenopathy.     Lower Body: No right inguinal adenopathy. No left inguinal adenopathy.  Skin:    General: Skin is warm.     Coloration: Skin is not jaundiced.     Findings: No lesion or rash.  Neurological:     General: No focal deficit present.     Mental Status: He is alert and oriented to person, place, and time. Mental status is at baseline.  Psychiatric:        Mood and Affect: Mood normal.        Behavior: Behavior normal.        Thought  Content: Thought content normal.    LABS:    Latest Reference Range & Units 07/16/21 00:00  Sodium 137 - 147  141  Potassium 3.4 - 5.3  3.9  Chloride 99 - 108  107  CO2 13 - 22  26 !  Glucose  188  BUN 4 - 21  17  Creatinine 0.6 - 1.3  0.9  Calcium 8.7 - 10.7  8.9  Alkaline Phosphatase 25 - 125  67  Albumin 3.5 - 5.0  4.4  AST 14 - 40  28  ALT 10 - 40  29  Bilirubin, Total  0.4  !: Data is abnormal  Latest Reference Range & Units 07/16/21 10:27  Iron 45 - 182 ug/dL 81  UIBC ug/dL 294  TIBC 250 - 450 ug/dL 375  Saturation Ratios 17.9 - 39.5 % 22  Ferritin 24 - 336 ng/mL 57  Folate >5.9 ng/mL 32.2    Latest Reference Range & Units 07/16/21 10:27  Total Protein ELP 6.0 - 8.5 g/dL 7.0  Albumin ELP 2.9 - 4.4 g/dL 3.9  Globulin, Total 2.2 - 3.9 g/dL 3.1 (C)  A/G Ratio 0.7 - 1.7  1.3 (C)  Alpha-1-Globulin 0.0 - 0.4 g/dL 0.2  Alpha-2-Globulin 0.4 - 1.0 g/dL 0.9  Beta Globulin 0.7 - 1.3 g/dL 1.0  Gamma Globulin 0.4 - 1.8 g/dL 1.0  M-SPIKE, % Not Observed g/dL Not Observed  SPE Interp.  Comment  Comment  Comment  (C): Corrected ASSESSMENT & PLAN:  A 81 y.o. male who I was asked to consult upon for mild anemia.  His hemoglobin of 13.8 is much better than what it has been previously.  Of note, other labs done today did not show any evidence of a nutritional deficiency being present. His serum protein electrophoresis also came back normal. He also has normal renal function.  As his hemoglobin is near 14 and the patient has no underlying hematologic problems, I will turn his care back over to his primary care office.  My recommendation is for his CBC to be checked 2-3 times per year.  I would not have a problem seeing him back in the future if new hematologic issues arise that require repeat clinical assessment.  The patient understands all the plans discussed today and is in agreement with them.  I do appreciate Angelina Sheriff, MD for his new consult.     Jaecion Dempster Macarthur Critchley, MD

## 2021-07-16 ENCOUNTER — Inpatient Hospital Stay (INDEPENDENT_AMBULATORY_CARE_PROVIDER_SITE_OTHER): Payer: 59 | Admitting: Oncology

## 2021-07-16 ENCOUNTER — Encounter: Payer: Self-pay | Admitting: Hematology and Oncology

## 2021-07-16 ENCOUNTER — Inpatient Hospital Stay: Payer: 59 | Attending: Oncology

## 2021-07-16 ENCOUNTER — Encounter: Payer: Self-pay | Admitting: Oncology

## 2021-07-16 DIAGNOSIS — M1990 Unspecified osteoarthritis, unspecified site: Secondary | ICD-10-CM | POA: Diagnosis not present

## 2021-07-16 DIAGNOSIS — Z79899 Other long term (current) drug therapy: Secondary | ICD-10-CM | POA: Insufficient documentation

## 2021-07-16 DIAGNOSIS — Z7984 Long term (current) use of oral hypoglycemic drugs: Secondary | ICD-10-CM | POA: Diagnosis not present

## 2021-07-16 DIAGNOSIS — E1143 Type 2 diabetes mellitus with diabetic autonomic (poly)neuropathy: Secondary | ICD-10-CM | POA: Diagnosis not present

## 2021-07-16 DIAGNOSIS — D649 Anemia, unspecified: Secondary | ICD-10-CM | POA: Insufficient documentation

## 2021-07-16 DIAGNOSIS — I10 Essential (primary) hypertension: Secondary | ICD-10-CM | POA: Diagnosis not present

## 2021-07-16 DIAGNOSIS — E7800 Pure hypercholesterolemia, unspecified: Secondary | ICD-10-CM | POA: Diagnosis not present

## 2021-07-16 DIAGNOSIS — G4733 Obstructive sleep apnea (adult) (pediatric): Secondary | ICD-10-CM | POA: Diagnosis not present

## 2021-07-16 DIAGNOSIS — Z7985 Long-term (current) use of injectable non-insulin antidiabetic drugs: Secondary | ICD-10-CM | POA: Diagnosis not present

## 2021-07-16 LAB — IRON AND TIBC
Iron: 81 ug/dL (ref 45–182)
Saturation Ratios: 22 % (ref 17.9–39.5)
TIBC: 375 ug/dL (ref 250–450)
UIBC: 294 ug/dL

## 2021-07-16 LAB — FERRITIN: Ferritin: 57 ng/mL (ref 24–336)

## 2021-07-16 LAB — VITAMIN B12: Vitamin B-12: 289 pg/mL (ref 180–914)

## 2021-07-16 LAB — CBC AND DIFFERENTIAL
HCT: 40 — AB (ref 41–53)
Hemoglobin: 13.8 (ref 13.5–17.5)
Neutrophils Absolute: 4.27
Platelets: 221 (ref 150–399)
WBC: 6.1

## 2021-07-16 LAB — HEPATIC FUNCTION PANEL
ALT: 29 (ref 10–40)
AST: 28 (ref 14–40)
Alkaline Phosphatase: 67 (ref 25–125)
Bilirubin, Total: 0.4

## 2021-07-16 LAB — COMPREHENSIVE METABOLIC PANEL
Albumin: 4.4 (ref 3.5–5.0)
Calcium: 8.9 (ref 8.7–10.7)

## 2021-07-16 LAB — BASIC METABOLIC PANEL
BUN: 17 (ref 4–21)
CO2: 26 — AB (ref 13–22)
Chloride: 107 (ref 99–108)
Creatinine: 0.9 (ref 0.6–1.3)
Glucose: 188
Potassium: 3.9 (ref 3.4–5.3)
Sodium: 141 (ref 137–147)

## 2021-07-16 LAB — FOLATE: Folate: 32.2 ng/mL (ref >5.9)

## 2021-07-16 LAB — CBC: RBC: 4.59 (ref 3.87–5.11)

## 2021-07-16 LAB — TSH: TSH: 2.996 u[IU]/mL (ref 0.350–4.500)

## 2021-07-19 LAB — PROTEIN ELECTROPHORESIS, SERUM
A/G Ratio: 1.3 (ref 0.7–1.7)
Albumin ELP: 3.9 g/dL (ref 2.9–4.4)
Alpha-1-Globulin: 0.2 g/dL (ref 0.0–0.4)
Alpha-2-Globulin: 0.9 g/dL (ref 0.4–1.0)
Beta Globulin: 1 g/dL (ref 0.7–1.3)
Gamma Globulin: 1 g/dL (ref 0.4–1.8)
Globulin, Total: 3.1 g/dL (ref 2.2–3.9)
Total Protein ELP: 7 g/dL (ref 6.0–8.5)

## 2021-07-22 DIAGNOSIS — D649 Anemia, unspecified: Secondary | ICD-10-CM | POA: Insufficient documentation

## 2021-07-22 HISTORY — DX: Anemia, unspecified: D64.9

## 2021-07-27 DIAGNOSIS — M5431 Sciatica, right side: Secondary | ICD-10-CM | POA: Diagnosis not present

## 2021-07-27 DIAGNOSIS — Z6832 Body mass index (BMI) 32.0-32.9, adult: Secondary | ICD-10-CM | POA: Diagnosis not present

## 2021-07-27 DIAGNOSIS — I10 Essential (primary) hypertension: Secondary | ICD-10-CM | POA: Diagnosis not present

## 2021-07-27 DIAGNOSIS — Z0000 Encounter for general adult medical examination without abnormal findings: Secondary | ICD-10-CM | POA: Diagnosis not present

## 2021-08-16 DIAGNOSIS — M5441 Lumbago with sciatica, right side: Secondary | ICD-10-CM | POA: Diagnosis not present

## 2021-08-16 DIAGNOSIS — M5431 Sciatica, right side: Secondary | ICD-10-CM | POA: Diagnosis not present

## 2021-08-18 DIAGNOSIS — M5431 Sciatica, right side: Secondary | ICD-10-CM | POA: Diagnosis not present

## 2021-08-18 DIAGNOSIS — M5441 Lumbago with sciatica, right side: Secondary | ICD-10-CM | POA: Diagnosis not present

## 2021-08-23 DIAGNOSIS — M5431 Sciatica, right side: Secondary | ICD-10-CM | POA: Diagnosis not present

## 2021-08-23 DIAGNOSIS — M5441 Lumbago with sciatica, right side: Secondary | ICD-10-CM | POA: Diagnosis not present

## 2021-08-26 DIAGNOSIS — M5431 Sciatica, right side: Secondary | ICD-10-CM | POA: Diagnosis not present

## 2021-08-26 DIAGNOSIS — M5441 Lumbago with sciatica, right side: Secondary | ICD-10-CM | POA: Diagnosis not present

## 2021-08-31 DIAGNOSIS — M5431 Sciatica, right side: Secondary | ICD-10-CM | POA: Diagnosis not present

## 2021-08-31 DIAGNOSIS — M5441 Lumbago with sciatica, right side: Secondary | ICD-10-CM | POA: Diagnosis not present

## 2021-09-07 DIAGNOSIS — M5431 Sciatica, right side: Secondary | ICD-10-CM | POA: Diagnosis not present

## 2021-09-07 DIAGNOSIS — M5441 Lumbago with sciatica, right side: Secondary | ICD-10-CM | POA: Diagnosis not present

## 2021-09-10 DIAGNOSIS — M5441 Lumbago with sciatica, right side: Secondary | ICD-10-CM | POA: Diagnosis not present

## 2021-09-10 DIAGNOSIS — M5431 Sciatica, right side: Secondary | ICD-10-CM | POA: Diagnosis not present

## 2021-09-14 DIAGNOSIS — M5441 Lumbago with sciatica, right side: Secondary | ICD-10-CM | POA: Diagnosis not present

## 2021-09-14 DIAGNOSIS — M5431 Sciatica, right side: Secondary | ICD-10-CM | POA: Diagnosis not present

## 2021-09-21 DIAGNOSIS — M5441 Lumbago with sciatica, right side: Secondary | ICD-10-CM | POA: Diagnosis not present

## 2021-09-21 DIAGNOSIS — M5431 Sciatica, right side: Secondary | ICD-10-CM | POA: Diagnosis not present

## 2021-09-28 DIAGNOSIS — M5441 Lumbago with sciatica, right side: Secondary | ICD-10-CM | POA: Diagnosis not present

## 2021-09-28 DIAGNOSIS — M5431 Sciatica, right side: Secondary | ICD-10-CM | POA: Diagnosis not present

## 2021-10-12 DIAGNOSIS — L720 Epidermal cyst: Secondary | ICD-10-CM | POA: Diagnosis not present

## 2021-10-12 DIAGNOSIS — L578 Other skin changes due to chronic exposure to nonionizing radiation: Secondary | ICD-10-CM | POA: Diagnosis not present

## 2021-10-12 DIAGNOSIS — D1801 Hemangioma of skin and subcutaneous tissue: Secondary | ICD-10-CM | POA: Diagnosis not present

## 2021-10-12 DIAGNOSIS — L570 Actinic keratosis: Secondary | ICD-10-CM | POA: Diagnosis not present

## 2021-10-12 DIAGNOSIS — L821 Other seborrheic keratosis: Secondary | ICD-10-CM | POA: Diagnosis not present

## 2021-10-12 DIAGNOSIS — I781 Nevus, non-neoplastic: Secondary | ICD-10-CM | POA: Diagnosis not present

## 2021-10-12 DIAGNOSIS — Z85828 Personal history of other malignant neoplasm of skin: Secondary | ICD-10-CM | POA: Diagnosis not present

## 2021-12-16 DIAGNOSIS — L600 Ingrowing nail: Secondary | ICD-10-CM | POA: Diagnosis not present

## 2021-12-16 DIAGNOSIS — E119 Type 2 diabetes mellitus without complications: Secondary | ICD-10-CM | POA: Diagnosis not present

## 2021-12-28 DIAGNOSIS — E7800 Pure hypercholesterolemia, unspecified: Secondary | ICD-10-CM | POA: Diagnosis not present

## 2021-12-28 DIAGNOSIS — I10 Essential (primary) hypertension: Secondary | ICD-10-CM | POA: Diagnosis not present

## 2021-12-28 DIAGNOSIS — Z6832 Body mass index (BMI) 32.0-32.9, adult: Secondary | ICD-10-CM | POA: Diagnosis not present

## 2021-12-28 DIAGNOSIS — E1165 Type 2 diabetes mellitus with hyperglycemia: Secondary | ICD-10-CM | POA: Diagnosis not present

## 2022-01-19 DIAGNOSIS — Z6832 Body mass index (BMI) 32.0-32.9, adult: Secondary | ICD-10-CM | POA: Diagnosis not present

## 2022-01-19 DIAGNOSIS — H6191 Disorder of right external ear, unspecified: Secondary | ICD-10-CM | POA: Diagnosis not present

## 2022-01-25 DIAGNOSIS — D485 Neoplasm of uncertain behavior of skin: Secondary | ICD-10-CM | POA: Diagnosis not present

## 2022-01-25 DIAGNOSIS — D1801 Hemangioma of skin and subcutaneous tissue: Secondary | ICD-10-CM | POA: Diagnosis not present

## 2022-02-10 DIAGNOSIS — E785 Hyperlipidemia, unspecified: Secondary | ICD-10-CM | POA: Diagnosis not present

## 2022-02-10 DIAGNOSIS — I10 Essential (primary) hypertension: Secondary | ICD-10-CM | POA: Diagnosis not present

## 2022-03-11 ENCOUNTER — Encounter: Payer: Self-pay | Admitting: Cardiology

## 2022-03-11 ENCOUNTER — Ambulatory Visit: Payer: Commercial Managed Care - PPO | Attending: Cardiology | Admitting: Cardiology

## 2022-03-11 VITALS — BP 115/67 | HR 81 | Ht 69.0 in | Wt 230.2 lb

## 2022-03-11 DIAGNOSIS — R079 Chest pain, unspecified: Secondary | ICD-10-CM | POA: Insufficient documentation

## 2022-03-11 DIAGNOSIS — E7800 Pure hypercholesterolemia, unspecified: Secondary | ICD-10-CM | POA: Diagnosis not present

## 2022-03-11 DIAGNOSIS — E119 Type 2 diabetes mellitus without complications: Secondary | ICD-10-CM | POA: Diagnosis not present

## 2022-03-11 DIAGNOSIS — R072 Precordial pain: Secondary | ICD-10-CM | POA: Diagnosis not present

## 2022-03-11 DIAGNOSIS — I10 Essential (primary) hypertension: Secondary | ICD-10-CM | POA: Insufficient documentation

## 2022-03-11 MED ORDER — METOPROLOL TARTRATE 100 MG PO TABS: 100.0000 mg | ORAL_TABLET | Freq: Once | ORAL | 0 refills | Status: DC

## 2022-03-11 NOTE — Patient Instructions (Addendum)
Medication Instructions:  Your physician recommends that you continue on your current medications as directed. Please refer to the Current Medication list given to you today.  *If you need a refill on your cardiac medications before your next appointment, please call your pharmacy*   Lab Work: Your physician recommends that you return for lab work in:   Labs 1 week before CT: BMP  If you have labs (blood work) drawn today and your tests are completely normal, you will receive your results only by: [place] (if you have MyChart) OR A paper copy in the mail If you have any lab test that is abnormal or we need to change your treatment, we will call you to review the results.   Testing/Procedures:   Your cardiac CT will be scheduled at one of the below locations:   [facility] [address] [place], [place] [address] (336) [place] [address] [place], [place] [address] [phone]  [facility] [place], [place] [address] [phone]  If scheduled at [facility], please arrive at the [facility] and Children's Entrance (Entrance C2) of [facility] 30 minutes prior to test start time. You can use the FREE valet parking offered at entrance C (encouraged to control the heart rate for the test)  Proceed to the [facility] Radiology Department (first floor) to check-in and test prep.  All radiology patients and guests should use entrance C2 at [facility], accessed from [facility], even though the hospital's physical address listed is [address]    If scheduled at [facility] or [facility], please arrive 15 mins early for check-in and test prep.   Please follow these instructions carefully (unless otherwise directed):  On the Night Before the Test: Be  sure to Drink plenty of water. Do not consume any caffeinated/decaffeinated beverages or chocolate 12 hours prior to your test. Do not take any antihistamines 12 hours prior to your test.  On the Day of the Test: Drink plenty of water until 1 hour prior to the test. Do not eat any food 1 hour prior to test. You may take your regular medications prior to the test.  Take metoprolol (Lopressor) two hours prior to test.      After the Test: Drink plenty of water. After receiving IV contrast, you may experience a mild flushed feeling. This is normal. On occasion, you may experience a mild rash up to 24 hours after the test. This is not dangerous. If this occurs, you can take Benadryl 25 mg and increase your fluid intake. If you experience trouble breathing, this can be serious. If it is severe call 911 IMMEDIATELY. If it is mild, please call our office. If you take any of these medications: Glipizide/Metformin, Avandament, Glucavance, please do not take 48 hours after completing test unless otherwise instructed.  We will call to schedule your test 2-4 weeks out understanding that some insurance companies will need an authorization prior to the service being performed.   For non-scheduling related questions, please contact the cardiac imaging nurse navigator should you have any questions/concerns: Marchia Bond, Cardiac Imaging Nurse Navigator Gordy Clement, Cardiac Imaging Nurse Navigator [facility] Direct Office Dial: [phone]   For scheduling needs, including cancellations and rescheduling, please call [place], [phone].    Follow-Up: At [facility], you  and your health needs are our priority.  As part of our continuing mission to provide you with exceptional heart care, we have created designated Provider Care Teams.  These Care Teams include your primary Cardiologist (physician) and Advanced Practice Providers (APPs -  Physician Assistants  and Nurse Practitioners) who all work together to provide you with the care you need, when you need it.  We recommend signing up for the patient portal called "MyChart".  Sign up information is provided on this After Visit Summary.  MyChart is used to connect with patients for Virtual Visits (Telemedicine).  Patients are able to view lab/test results, encounter notes, upcoming appointments, etc.  Non-urgent messages can be sent to your provider as well.   To learn more about what you can do with MyChart, go to [email or web address].    Your next appointment:   6 month(s)  The format for your next appointment:   In Person  Provider:   Shirlee More, MD    Other Instructions Get Apple watch to track steps.  Important Information About Sugar

## 2022-03-11 NOTE — Progress Notes (Signed)
Cardiology Office Note:    Date:  03/11/2022   ID:  Jacob Patel, DOB April 11, 1941, MRN [id number]  PCP:  Angelina Sheriff, MD  Cardiologist:  Shirlee More, MD    Referring MD: Angelina Sheriff, MD    ASSESSMENT:    1. Chest pain, unspecified type   2. Benign essential hypertension   3. Pure hypercholesterolemia   4. Type 2 diabetes mellitus without complication, without long-term current use of insulin (HCC)    PLAN:    In order of problems listed above:  Ramces has a stable chest pain syndrome consistent with typical angina and will undergo cardiac CTA to guide if he has high risk anatomy that would benefit from revascularization and aspirin therapy.  His hypertension is controlled he is on a good medical regimen including beta-blocker calcium channel blocker ACE inhibitor we will continue these lipid-lowering therapy with a high intensity statin and diabetes recently had medications altered to transition Trulicity to Ozempic and to continue his SGLT2 inhibitor I encouraged him to get an Apple watch to track steps threshold for benefit is 2500 a day peak benefit 8500 steps per day Follow up in a few months after his CTA we will decide on aspirin therapy at that time   Next appointment: 3 months   Medication Adjustments/Labs and Tests Ordered: Current medicines are reviewed at length with the patient today.  Concerns regarding medicines are outlined above.  No orders of the defined types were placed in this encounter.  No orders of the defined types were placed in this encounter.  Chief complaint please reassess regarding utility of cardiac CT   History of Present Illness:    Jacob Patel is a 81 y.o. male with a hx of type 2 diabetes hypertension hyperlipidemia last seen 03/22/2021 for cardiovascular risk counseling.  At that time he was on good appropriate antihypertensive therapy diabetic treatment and high intensity statin.  The question is whether he would  benefit from a coronary artery calcium score in his age group and already on a statin I did not think it was indicated.  He was not having chest pain to prompt cardiac CTA.  Follow-up was left in the office soon as needed basis.  Compliance with diet, lifestyle and medications: Yes  Jacob Patel is back again to discuss the issue of cardiac CTA General AA coronary calcium score is indicated age 29-70 and and low to intermediate populations to guide lipid-lowering therapy. In his case he has exertional angina when he pushes himself hard every few months he gets typical angina substernal tightness pressure with activity relieved with rest he is aware of the threshold and avoids these activities the pattern is stable No edema shortness of breath palpitation or syncope I discussed with him utility of a cardiac CTA to define high risk individuals benefiting from revascularization we will plan to go ahead and do a CTA he wants to do it at [facility].  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   carvedilol (COREG) 6.25 MG tablet Take 6.25 mg by mouth 2 (two) times daily with a meal.   Cholecalciferol  (VITAMIN D) 50 MCG (2000 UT) CAPS Take 2,000 Units by mouth daily.   Dulaglutide (TRULICITY) 1.5 EV/0.3JK SOPN Inject 0.5 mLs into the skin once a week.   empagliflozin (JARDIANCE) 25 MG TABS tablet Take 25 mg by mouth daily.   fexofenadine (ALLEGRA) 180 MG tablet Take 180 mg by mouth daily.   lansoprazole (PREVACID) 30 MG capsule Take 30 mg by mouth daily at 12 noon.   lisinopril (ZESTRIL) 10 MG tablet Take 10 mg by mouth daily.   metFORMIN (GLUCOPHAGE) 1000 MG tablet Take 1,000 mg by mouth 2 (two) times daily with a meal.   Niacinamide-Zn-Cu-Methfo-Se-Cr (NICOTINAMIDE PO) Take 1 tablet by mouth 2 (two) times daily. Strength unknown   rosuvastatin (CRESTOR) 40 MG tablet Take 40 mg by mouth at bedtime.     Allergies:   Patient has no known allergies.   Social History   Socioeconomic History   Marital status: Married    Spouse name: PHYLLIS   Number of children: 3   Years of education: 12 + 4   Highest education level: Not on file  Occupational History   Occupation: RETIRED FARMER  Tobacco Use   Smoking status: Never   Smokeless tobacco: Never  Vaping Use   Vaping Use: Never used  Substance and Sexual Activity   Alcohol use: Never   Drug use: Never   Sexual activity: Not Currently  Other Topics Concern   Not on file  Social History Narrative   Caffeine use: mild consumption   Has POA   QUAKER FAITH - NO RELIGIOUS, ETHNICAL, OR CULTURAL CONCERNS   Social Determinants of Health   Financial Resource Strain: Not on file  Food Insecurity: Not on file  Transportation Needs: Not on file  Physical Activity: Not on file  Stress: Not on file  Social Connections: Not on file     Family History: The patient's family history includes CAD in his brother, father, and mother; Cancer in his maternal aunt; Congestive Heart Failure (age of onset: 23) in his father; Coronary artery disease in his brother; GI problems in his brother and father; Heart Problems in his mother; Testicular  cancer in his paternal uncle; Valvular heart disease in his brother. ROS:   Please see the history of present illness.    All other systems reviewed and are negative.  EKGs/Labs/Other Studies Reviewed:    The following studies were reviewed today:  EKG:  EKG ordered today and personally reviewed.  The ekg ordered today demonstrates sinus rhythm remains normal  Recent Labs: 07/16/2021: ALT 29; BUN 17; Creatinine 0.9; Hemoglobin 13.8; Platelets 221; Potassium 3.9; Sodium 141; TSH 2.996  Recent Lipid Panel Lipid profile 12/28/2021 cholesterol 135 triglycerides 159 HDL 33 A1c 7.3% creatinine 0.90  Physical Exam:    VS:  BP 115/67 (BP Location: Right Arm, Patient Position: Sitting)   Pulse 81   Ht '5\' 9"'$  (1.753 m)  Wt 230 lb 3.2 oz (104.4 kg)   SpO2 97%   BMI 33.99 kg/m     Wt Readings from Last 3 Encounters:  03/11/22 230 lb 3.2 oz (104.4 kg)  07/16/21 233 lb 3.2 oz (105.8 kg)  03/22/21 232 lb 12.8 oz (105.6 kg)     GEN: Appears his age looks healthy well nourished, well developed in no acute distress HEENT: Normal NECK: No JVD; No carotid bruits LYMPHATICS: No lymphadenopathy CARDIAC: RRR, no murmurs, rubs, gallops RESPIRATORY:  Clear to auscultation without rales, wheezing or rhonchi  ABDOMEN: Soft, non-tender, non-distended MUSCULOSKELETAL:  No edema; No deformity  SKIN: Warm and dry NEUROLOGIC:  Alert and oriented x 3 PSYCHIATRIC:  Normal affect    Signed, Shirlee More, MD  03/11/2022 9:07 AM    [place]

## 2022-03-23 LAB — BASIC METABOLIC PANEL
BUN/Creatinine Ratio: 15 (ref 10–24)
BUN: 15 mg/dL (ref 8–27)
CO2: 23 mmol/L (ref 20–29)
Calcium: 9.3 mg/dL (ref 8.6–10.2)
Chloride: 103 mmol/L (ref 96–106)
Creatinine, Ser: 1.03 mg/dL (ref 0.76–1.27)
Glucose: 182 mg/dL — ABNORMAL HIGH (ref 70–99)
Potassium: 4.4 mmol/L (ref 3.5–5.2)
Sodium: 141 mmol/L (ref 134–144)
eGFR: 73 mL/min/{1.73_m2} (ref >59)

## 2022-03-25 ENCOUNTER — Telehealth (HOSPITAL_COMMUNITY): Payer: Self-pay | Admitting: Emergency Medicine

## 2022-03-25 NOTE — Telephone Encounter (Signed)
Reaching out to patient to offer assistance regarding upcoming cardiac imaging study; pt verbalizes understanding of appt date/time, parking situation and where to check in, pre-test NPO status and medications ordered, and verified current allergies; name and call back number provided for further questions should they arise Jacob Bond RN Navigator Cardiac Imaging [facility] [phone] office [phone] cell  Arrival 730, w/c entrance Metoprolol Denies iv issues Aware nitro Holding BP meds

## 2022-03-29 ENCOUNTER — Ambulatory Visit (HOSPITAL_COMMUNITY)
Admission: RE | Admit: 2022-03-29 | Discharge: 2022-03-29 | Disposition: A | Discharge status: Home / Self Care | Payer: Commercial Managed Care - PPO | Source: Ambulatory Visit | Attending: Cardiology | Admitting: Cardiology

## 2022-03-29 ENCOUNTER — Ambulatory Visit (HOSPITAL_COMMUNITY): Payer: Commercial Managed Care - PPO

## 2022-03-29 DIAGNOSIS — R072 Precordial pain: Secondary | ICD-10-CM | POA: Diagnosis not present

## 2022-03-29 DIAGNOSIS — I2510 Atherosclerotic heart disease of native coronary artery without angina pectoris: Secondary | ICD-10-CM | POA: Diagnosis not present

## 2022-03-29 MED ORDER — METOPROLOL TARTRATE 5 MG/5ML IV SOLN
5.0000 mg | INTRAVENOUS | Status: DC | PRN
  Administered 2022-03-29: 5 mg via INTRAVENOUS

## 2022-03-29 MED ORDER — NITROGLYCERIN 0.4 MG SL SUBL
0.8000 mg | SUBLINGUAL_TABLET | Freq: Once | SUBLINGUAL | Status: AC
Start: 2022-03-29 — End: 2022-03-29

## 2022-03-29 MED ORDER — IOHEXOL 350 MG/ML SOLN
100.0000 mL | Freq: Once | INTRAVENOUS | Status: AC | PRN
  Administered 2022-03-29: 100 mL via INTRAVENOUS

## 2022-03-29 MED ORDER — DILTIAZEM HCL 25 MG/5ML IV SOLN
INTRAVENOUS | Status: AC
  Administered 2022-03-29: 5 mg via INTRAVENOUS
  Filled 2022-03-29: qty 5

## 2022-03-29 MED ORDER — NITROGLYCERIN 0.4 MG SL SUBL
SUBLINGUAL_TABLET | SUBLINGUAL | Status: AC
  Administered 2022-03-29: 0.8 mg via SUBLINGUAL
  Filled 2022-03-29: qty 2

## 2022-03-29 MED ORDER — DILTIAZEM HCL 25 MG/5ML IV SOLN: 5.0000 mg | Freq: Once | INTRAVENOUS | Status: AC

## 2022-03-29 MED ORDER — METOPROLOL TARTRATE 5 MG/5ML IV SOLN
INTRAVENOUS | Status: AC
  Administered 2022-03-29: 5 mg via INTRAVENOUS
  Filled 2022-03-29: qty 10

## 2022-04-21 DIAGNOSIS — Z85828 Personal history of other malignant neoplasm of skin: Secondary | ICD-10-CM | POA: Diagnosis not present

## 2022-04-21 DIAGNOSIS — D1801 Hemangioma of skin and subcutaneous tissue: Secondary | ICD-10-CM | POA: Diagnosis not present

## 2022-04-21 DIAGNOSIS — I781 Nevus, non-neoplastic: Secondary | ICD-10-CM | POA: Diagnosis not present

## 2022-04-21 DIAGNOSIS — L570 Actinic keratosis: Secondary | ICD-10-CM | POA: Diagnosis not present

## 2022-04-21 DIAGNOSIS — L578 Other skin changes due to chronic exposure to nonionizing radiation: Secondary | ICD-10-CM | POA: Diagnosis not present

## 2022-04-21 DIAGNOSIS — E669 Obesity, unspecified: Secondary | ICD-10-CM | POA: Diagnosis not present

## 2022-04-26 ENCOUNTER — Ambulatory Visit (HOSPITAL_COMMUNITY)
Admission: RE | Admit: 2022-04-26 | Discharge: 2022-04-26 | Disposition: A | Discharge status: Home / Self Care | Payer: Commercial Managed Care - PPO | Source: Ambulatory Visit | Attending: Cardiology | Admitting: Cardiology

## 2022-04-26 ENCOUNTER — Other Ambulatory Visit: Payer: Self-pay | Admitting: Cardiology

## 2022-04-26 DIAGNOSIS — R931 Abnormal findings on diagnostic imaging of heart and coronary circulation: Secondary | ICD-10-CM | POA: Insufficient documentation

## 2022-04-26 DIAGNOSIS — I2510 Atherosclerotic heart disease of native coronary artery without angina pectoris: Secondary | ICD-10-CM

## 2022-04-27 ENCOUNTER — Telehealth: Payer: Self-pay

## 2022-04-27 MED ORDER — NITROGLYCERIN 0.4 MG SL SUBL: 0.4000 mg | SUBLINGUAL_TABLET | SUBLINGUAL | 6 refills | Status: AC | PRN

## 2022-04-27 MED ORDER — ASPIRIN 81 MG PO TBEC: 81.0000 mg | DELAYED_RELEASE_TABLET | Freq: Every day | ORAL | 3 refills | Status: DC

## 2022-04-27 NOTE — Telephone Encounter (Signed)
-----   Message from Richardo Priest, MD sent at 04/26/2022  6:57 PM EST ----- The coronary artery calcium score and index of atherosclerosis is very high he needs to remain on rosuvastatin  As we expected there is blockage of the coronary arteries the pattern he has I think is best treated with medication  Please be sure he has a prescription for nitroglycerin with instruction 1 sublingual as needed chest discomfort and should be taking aspirin 81 mg daily.  He has an appointment for office follow-up already

## 2022-05-12 DIAGNOSIS — I10 Essential (primary) hypertension: Secondary | ICD-10-CM | POA: Diagnosis not present

## 2022-05-12 DIAGNOSIS — E785 Hyperlipidemia, unspecified: Secondary | ICD-10-CM | POA: Diagnosis not present

## 2022-05-17 NOTE — Progress Notes (Unsigned)
Cardiology Office Note:    Date:  05/18/2022   ID:  Jacob Patel, DOB 1941-04-14, MRN [id number]  PCP:  Angelina Sheriff, MD  Cardiologist:  Shirlee More, MD    Referring MD: Angelina Sheriff, MD    ASSESSMENT:    1. Coronary artery disease of native artery of native heart with stable angina pectoris ([place])   2. Benign essential hypertension   3. Pure hypercholesterolemia   4. Type 2 diabetes mellitus without complication, without long-term current use of insulin (HCC)    PLAN:    In order of problems listed above:  His cardiac CTA confirms the presence of CAD does not have left main coronary artery stenosis at this time is best treated medically he is on a good medical regimen I encouraged him to get involved in a regular activity program and I think he would do very well with cardiac rehabilitation he is thinking about it in the interim we will start a walking program smart watch and track his steps with a goal initially greater than 5000/day.  He has a prescription for nitroglycerin. Well-controlled hypertension continue current treatment including calcium channel blocker and beta-blocker Continue his high intensity statin and he has follow-up labs in his PCP office after the first of the year Stable diabetes managed by his PCP   Next appointment: 6 months   Medication Adjustments/Labs and Tests Ordered: Current medicines are reviewed at length with the patient today.  Concerns regarding medicines are outlined above.  No orders of the defined types were placed in this encounter.  No orders of the defined types were placed in this encounter.  Chief complaint follow-up for CAD after cardiac CTA  History of Present Illness:    Jacob Patel is a 81 y.o. male with a hx of type 2 diabetes hypertension hyperlipidemia last seen 03/11/2022 with chest pain possible angina and advised to undergo cardiac CTA.Marland Kitchen  Compliance with diet, lifestyle and medications: Yes  His  wife is present participates in evaluation decision making. We discussed the results of his CTA confirming the presence of CAD complete occlusion of the right coronary artery diffuse moderate disease left anterior descending coronary artery He is on good medical therapy aspirin statin beta-blocker antihypertensives not having angina but he has become very sedentary We discussed the goals of ongoing medical treatment and if refractory consideration of PCI however I reviewed his FFR my partner Dr. Agustin Cree and he did not have a focal drop in FFR and does not appear to have a significant flow-limiting stenosis focally I think he benefit from lifestyle activity weight loss I encouraged him to go to cardiac rehab he will consider they will start walking 20 minutes a day and he will get a smart watch to track his steps.  He had a cardiac CTA reported 04/26/2022 coronary calcium score severely elevated 2798 89th percentile severe CAD was present with chronic occlusion of the proximal right coronary artery moderate LAD and left circumflex diffuse disease noted with normal FFR of the left circumflex.  FFR in the distal LAD was diminished but there was not a significant sequential drop across the proximal stenosis the left anterior descending coronary artery.  Also diffuse moderate disease in the left anterior descending coronary artery.  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   aspirin EC 81 MG tablet Take 1 tablet (81 mg total) by mouth daily. Swallow whole.   carvedilol (COREG) 6.25 MG tablet Take 6.25 mg by mouth 2 (two) times daily with a meal.   Cholecalciferol (VITAMIN D) 50 MCG (2000 UT) CAPS Take 2,000 Units by mouth daily.   dapagliflozin propanediol (FARXIGA) 10 MG TABS tablet Take 10 mg by mouth daily.   fexofenadine (ALLEGRA) 180 MG tablet Take 180 mg by mouth daily.   lansoprazole (PREVACID) 30 MG capsule Take 30 mg by mouth daily at 12 noon.   lisinopril (ZESTRIL) 10 MG tablet Take 10 mg by mouth daily.   metFORMIN (GLUCOPHAGE) 1000 MG tablet Take 1,000 mg by mouth 2 (two) times daily with a meal.   Niacinamide-Zn-Cu-Methfo-Se-Cr (NICOTINAMIDE PO) Take 1 tablet by mouth 2 (two) times daily. Strength unknown   nitroGLYCERIN (NITROSTAT) 0.4 MG SL tablet Place 1 tablet (0.4 mg total) under the tongue every 5 (five) minutes as needed.   rosuvastatin (CRESTOR) 40 MG tablet Take 40 mg by mouth at bedtime.   Semaglutide (OZEMPIC, 1 MG/DOSE, [place]) Inject 1 mg into the skin once a week.     Allergies:   Patient has no known allergies.   Social History   Socioeconomic History   Marital status: Married    Spouse name: PHYLLIS   Number of children: 3   Years of education: 12 + 4   Highest education level: Not on file  Occupational History   Occupation: RETIRED FARMER  Tobacco Use   Smoking status: Never   Smokeless tobacco: Never  Vaping Use   Vaping Use: Never used  Substance and Sexual Activity   Alcohol use: Never   Drug use: Never   Sexual activity: Not Currently  Other Topics Concern   Not on file  Social History Narrative   Caffeine use: mild consumption   Has POA    QUAKER FAITH - NO RELIGIOUS, ETHNICAL, OR CULTURAL CONCERNS   Social Determinants of Health   Financial Resource Strain: Not on file  Food Insecurity: Not on file  Transportation Needs: Not on file  Physical Activity: Not on file  Stress: Not on file  Social Connections: Not on file     Family History: The patient's family history includes CAD in his brother, father, and mother; Cancer in his maternal aunt; Congestive Heart Failure (age of onset: 23) in his father; Coronary artery disease in his brother; GI problems in his brother and father; Heart Problems in his mother; Testicular cancer in his paternal uncle; Valvular heart disease in his brother. ROS:   Please see the history of present illness.    All other systems reviewed and are negative.  EKGs/Labs/Other Studies  Reviewed:    The following studies were reviewed today:   Recent Labs: 07/16/2021: ALT 29; Hemoglobin 13.8; Platelets 221; TSH 2.996 03/22/2022: BUN 15; Creatinine, Ser 1.03; Potassium 4.4; Sodium 141  Recent Lipid Panel 12/28/2021 cholesterol 135 triglycerides 159 HDL 33 non-HDL cholesterol 102  Physical Exam:    VS:  BP 120/62 (BP Location: Right Arm, Patient Position: Sitting, Cuff Size: Normal)   Pulse 88   Ht '5\' 9"'$  (1.753 m)   Wt 232 lb 12.8 oz (105.6 kg)   SpO2 97%   BMI 34.38 kg/m     Wt Readings from Last 3 Encounters:  05/18/22 232 lb 12.8 oz (105.6 kg)  03/11/22 230 lb 3.2 oz (104.4 kg)  07/16/21 233 lb 3.2 oz (105.8 kg)     GEN: Obese BMI approaches 35 well nourished, well developed in no acute distress HEENT: Normal NECK: No JVD; No carotid bruits LYMPHATICS: No lymphadenopathy CARDIAC: RRR, no murmurs, rubs, gallops RESPIRATORY:  Clear to auscultation without rales, wheezing or rhonchi  ABDOMEN: Soft, non-tender, non-distended MUSCULOSKELETAL:  No edema; No deformity  SKIN: Warm and dry NEUROLOGIC:  Alert and oriented x 3 PSYCHIATRIC:  Normal affect    Signed, Shirlee More, MD   05/18/2022 10:08 AM    [place]

## 2022-05-18 ENCOUNTER — Encounter: Payer: Self-pay | Admitting: Cardiology

## 2022-05-18 ENCOUNTER — Ambulatory Visit: Payer: Commercial Managed Care - PPO | Attending: Cardiology | Admitting: Cardiology

## 2022-05-18 VITALS — BP 120/62 | HR 88 | Ht 69.0 in | Wt 232.8 lb

## 2022-05-18 DIAGNOSIS — E119 Type 2 diabetes mellitus without complications: Secondary | ICD-10-CM | POA: Diagnosis not present

## 2022-05-18 DIAGNOSIS — I25118 Atherosclerotic heart disease of native coronary artery with other forms of angina pectoris: Secondary | ICD-10-CM | POA: Insufficient documentation

## 2022-05-18 DIAGNOSIS — I10 Essential (primary) hypertension: Secondary | ICD-10-CM | POA: Insufficient documentation

## 2022-05-18 DIAGNOSIS — E7800 Pure hypercholesterolemia, unspecified: Secondary | ICD-10-CM | POA: Diagnosis not present

## 2022-05-18 NOTE — Patient Instructions (Signed)
Medication Instructions:  Your physician recommends that you continue on your current medications as directed. Please refer to the Current Medication list given to you today.  *If you need a refill on your cardiac medications before your next appointment, please call your pharmacy*   Lab Work: None If you have labs (blood work) drawn today and your tests are completely normal, you will receive your results only by: MyChart Message (if you have MyChart) OR A paper copy in the mail If you have any lab test that is abnormal or we need to change your treatment, we will call you to review the results.   Testing/Procedures: None   Follow-Up: At [facility], you and your health needs are our priority.  As part of our continuing mission to provide you with exceptional heart care, we have created designated Provider Care Teams.  These Care Teams include your primary Cardiologist (physician) and Advanced Practice Providers (APPs -  Physician Assistants and Nurse Practitioners) who all work together to provide you with the care you need, when you need it.  We recommend signing up for the patient portal called "MyChart".  Sign up information is provided on this After Visit Summary.  MyChart is used to connect with patients for Virtual Visits (Telemedicine).  Patients are able to view lab/test results, encounter notes, upcoming appointments, etc.  Non-urgent messages can be sent to your provider as well.   To learn more about what you can do with MyChart, go to [email or web address].    Your next appointment:   6 month(s)  The format for your next appointment:   In Person  Provider:   Brian Munley, MD    Other Instructions None  Important Information About Sugar       

## 2022-05-20 DIAGNOSIS — H25813 Combined forms of age-related cataract, bilateral: Secondary | ICD-10-CM | POA: Diagnosis not present

## 2022-05-20 DIAGNOSIS — H353131 Nonexudative age-related macular degeneration, bilateral, early dry stage: Secondary | ICD-10-CM | POA: Diagnosis not present

## 2022-05-20 DIAGNOSIS — H40033 Anatomical narrow angle, bilateral: Secondary | ICD-10-CM | POA: Diagnosis not present

## 2022-06-21 DIAGNOSIS — H25811 Combined forms of age-related cataract, right eye: Secondary | ICD-10-CM | POA: Diagnosis not present

## 2022-06-21 DIAGNOSIS — E1136 Type 2 diabetes mellitus with diabetic cataract: Secondary | ICD-10-CM | POA: Diagnosis not present

## 2022-06-21 DIAGNOSIS — H259 Unspecified age-related cataract: Secondary | ICD-10-CM | POA: Diagnosis not present

## 2022-08-04 DIAGNOSIS — L600 Ingrowing nail: Secondary | ICD-10-CM | POA: Diagnosis not present

## 2022-08-04 DIAGNOSIS — E119 Type 2 diabetes mellitus without complications: Secondary | ICD-10-CM | POA: Diagnosis not present

## 2022-11-16 NOTE — Progress Notes (Unsigned)
Cardiology Office Note:    Date:  11/17/2022   ID:  Jacob Patel, DOB 12-24-1940, MRN [id number]  PCP:  Noni Saupe, MD  Cardiologist:  Norman Herrlich, MD    Referring MD: Noni Saupe, MD    ASSESSMENT:    1. Agatston coronary artery calcium score greater than 400   2. Coronary artery disease of native artery of native heart with stable angina pectoris (HCC)   3. Benign essential hypertension   4. Pure hypercholesterolemia   5. Type 2 diabetes mellitus without complication, without long-term current use of insulin (HCC)    PLAN:    In order of problems listed above:  [name] has stable angina pectoris doing well with medical therapy and will continue aspirin carvedilol amlodipine and rate limiting his high intensity statin and have asked him to stop his niacin supplement. If symptoms progress we could add oral mononitrate's I tensions well-controlled continue to trend record and utilize his home blood pressures and continue his beta-blocker ACE inhibitor Continue his high intensity statin recent labs requested On good cardioprotective therapy including semaglutide Activate his smart watch for atrial fibrillation detection and high or low rate limits given instructions to work with his family   Next appointment: I will plan to see him in 1 year   Medication Adjustments/Labs and Tests Ordered: Current medicines are reviewed at length with the patient today.  Concerns regarding medicines are outlined above.  No orders of the defined types were placed in this encounter.  No orders of the defined types were placed in this encounter.    History of Present Illness:    Jacob Patel is a 82 y.o. male with a hx of type 2 diabetes hypertension hyperlipidemia and coronary artery disease last seen 05/18/2022.  He had a cardiac CTA reported 04/26/2022 coronary calcium score severely elevated 2798 89th percentile severe CAD was present with chronic occlusion of the  proximal right coronary artery moderate LAD and left circumflex diffuse disease noted with normal FFR of the left circumflex.  FFR in the distal LAD was diminished but there was not a significant sequential drop across the proximal stenosis the left anterior descending coronary artery.  Also diffuse moderate disease in the left anterior descending coronary artery.  Compliance with diet, lifestyle and medications: Yes   He remains active he has mild predictable exertional angina after his evening meal when he walks up an incline quickly he backs off symptoms resolved had not progressed have not been severe do not occur at rest and has not needed nitroglycerin He is followed up with Dr. Randa Lynn in [place] labs were done I cannot see them in epic or [facility] and will request a copy he said his lipids were at target And is taking niacin along with the statin its relatively contraindicated and I have asked him to stop at this time His home blood pressure runs 120-130/70-80 with a validated follow-up device in good technique Tolerates his statin without muscle pain or weakness He has a smart watch but has not set up the EKG and heart rhythm monitoring and we gave him an instruction sheet CHF in the setting of work with his family to accomplish this goal Past Medical History:  Diagnosis Date   Anxiety state    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   aspirin EC 81 MG tablet Take 1 tablet (81 mg total) by mouth daily. Swallow whole.   carvedilol (COREG) 6.25 MG tablet Take 6.25 mg by mouth 2 (two) times daily with a meal.   Cholecalciferol (VITAMIN D) 50 MCG (2000 UT) CAPS Take 2,000 Units by mouth daily.   dapagliflozin propanediol (FARXIGA) 10 MG TABS tablet Take 10 mg by mouth daily.   fexofenadine (ALLEGRA) 180 MG tablet Take 180 mg by mouth daily.   lansoprazole (PREVACID) 30 MG capsule Take 30 mg by mouth in the morning.   lisinopril (ZESTRIL) 10 MG tablet Take 10 mg by mouth daily.   metFORMIN (GLUCOPHAGE) 1000 MG tablet Take 1,000 mg by mouth 2 (two) times daily with a meal.   Niacinamide-Zn-Cu-Methfo-Se-Cr (NICOTINAMIDE PO) Take 1 tablet by mouth 2 (two) times daily. Strength unknown   nitroGLYCERIN (NITROSTAT) 0.4 MG SL tablet Place 1 tablet (0.4 mg total) under the tongue every 5 (five) minutes as needed.   rosuvastatin (CRESTOR) 40 MG tablet Take 40 mg by mouth at bedtime.   Semaglutide (OZEMPIC, 1 MG/DOSE, [place]) Inject 1 mg into the skin once a week.     Allergies:   Patient has no known allergies.   Social History   Socioeconomic History   Marital status: Married    Spouse name: PHYLLIS   Number of children: 3   Years of education: 12 + 4   Highest education level: Not on file  Occupational History   Occupation: RETIRED FARMER  Tobacco Use   Smoking status: Never   Smokeless tobacco: Never  Vaping Use   Vaping Use: Never used  Substance and Sexual Activity   Alcohol use: Never   Drug use: Never   Sexual activity: Not Currently  Other Topics Concern   Not on file  Social History Narrative   Caffeine use: mild consumption   Has POA   QUAKER FAITH - NO RELIGIOUS, ETHNICAL, OR CULTURAL CONCERNS   Social Determinants of Health   Financial Resource Strain: Not on file  Food Insecurity: Not on file  Transportation Needs: Not on file  Physical Activity: Not on file  Stress: Not on file  Social  Connections: Not on file     Family History: The patient's family history includes CAD in his brother, father, and mother; Cancer in his maternal aunt; Congestive Heart Failure (age of onset: 57) in his father; Coronary artery disease in his brother; GI problems in his brother and father; Heart Problems in his mother; Testicular cancer in his paternal uncle; Valvular heart disease in his brother. ROS:   Please see the history of present illness.    All other systems reviewed and are negative.  EKGs/Labs/Other Studies Reviewed:    The following studies were reviewed today:  Cardiac Studies & Procedures          CT SCANS  CT CORONARY MORPH W/CTA COR W/SCORE 04/26/2022  Addendum 04/26/2022 10:01 AM ADDENDUM  REPORT: 04/26/2022 09:58  CLINICAL DATA:  82 year old male with chest pain  EXAM: Cardiac/Coronary  CTA  TECHNIQUE: The patient was scanned on a [employer].  FINDINGS: A 120 kV prospective scan was triggered in the descending thoracic aorta at 111 HU's. Axial non-contrast 3 mm slices were carried out through the heart. The data set was analyzed on a dedicated work station and scored using the Agatson method. Gantry rotation speed was 250 msecs and collimation was .6 mm. 0.8 mg of sl NTG was given. The 3D data set was reconstructed in 5% intervals of the 67-82 % of the R-R cycle. Diastolic phases were analyzed on a dedicated work station using MPR, MIP and VRT modes. The patient received 80 cc of contrast.  Image quality: good  Aorta:  Normal size.  Aortic atherosclerosis.  No dissection.  Aortic Valve:  Trileaflet.  No calcifications.  Coronary Arteries:  Normal coronary origin.  LEFT dominance.  RCA- there is severe calcified plaque with CTO (occlusion) of proximal RCA.  Left main is a large artery that gives rise to LAD and LCX arteries. There is distal calcified plaque 0-24% stenosis.  LAD is a large vessel that has severe diffuse calcified  plaque with moderate stenosis 50-69% (cumulative abnormal FFR) .  LCX is a large dominant artery that gives rise to PDA. There is diffuse calcified plaque, 50-69% stenosis.  Other findings:  Normal pulmonary vein drainage into the left atrium.  Normal left atrial appendage without a thrombus.  Normal size of the pulmonary artery.  Please see radiology report for non cardiac findings.  IMPRESSION: 1. Coronary calcium score of 2798. This was 49 percentile for age and sex matched control.  2. Normal coronary origin with LEFT dominance.  3. Severe CAD. CTO of proximal RCA, moderate LAD and Circ diffuse calcified plaque/stenosis. FFR verifies CTO of RCA, 0.81 to 0.63 of mid to distal LAD (abnormal), normal Circ FFR.  4. Aortic atherosclerosis.   Electronically Signed By: Donato Schultz M.D. On: 04/26/2022 09:58  Narrative EXAM: OVER-READ INTERPRETATION  CT CHEST  The following report is a limited chest CT over-read performed by radiologist Dr. Trudie Reed of [facility] on 03/29/2022. This over-read does not include interpretation of cardiac or coronary anatomy or pathology. The coronary calcium score and cardiac CTA interpretation by the cardiologist is attached.  COMPARISON:  None Available.  FINDINGS: Atherosclerotic calcifications in the thoracic aorta. Within the visualized portions of the thorax there are no suspicious appearing pulmonary nodules or masses, there is no acute consolidative airspace disease, no pleural effusions, no pneumothorax and no lymphadenopathy. Visualized portions of the upper abdomen are unremarkable. There are no aggressive appearing lytic or blastic lesions noted in the visualized portions of the skeleton.  IMPRESSION: 1.  Aortic Atherosclerosis (ICD10-I70.0).  Electronically Signed: By: Trudie Reed M.D. On: 03/30/2022 09:06           Recent Labs: 03/22/2022: BUN 15; Creatinine, Ser 1.03; Potassium 4.4;  Sodium 141  Recent Lipid Panel No results found for: "CHOL", "TRIG", "HDL", "CHOLHDL", "VLDL", "LDLCALC", "LDLDIRECT"  Physical Exam:    VS:  BP 122/68 (BP Location: Right Arm, Patient Position: Sitting)   Pulse 90   Ht 5\' 10"  (1.778 m)   Wt 229 lb (103.9 kg)   SpO2 96%   BMI 32.86 kg/m     Wt Readings from Last 3 Encounters:  11/17/22 229 lb (103.9 kg)  05/18/22 232 lb 12.8 oz (105.6 kg)  03/11/22  230 lb 3.2 oz (104.4 kg)     GEN:  Well nourished, well developed in no acute distress HEENT: Normal NECK: No JVD; No carotid bruits LYMPHATICS: No lymphadenopathy CARDIAC: RRR, no murmurs, rubs, gallops RESPIRATORY:  Clear to auscultation without rales, wheezing or rhonchi  ABDOMEN: Soft, non-tender, non-distended MUSCULOSKELETAL:  No edema; No deformity  SKIN: Warm and dry NEUROLOGIC:  Alert and oriented x 3 PSYCHIATRIC:  Normal affect    Signed, Norman Herrlich, MD  11/17/2022 8:53 AM    [facility]

## 2022-11-17 ENCOUNTER — Ambulatory Visit: Payer: Commercial Managed Care - PPO | Attending: Cardiology | Admitting: Cardiology

## 2022-11-17 ENCOUNTER — Encounter: Payer: Self-pay | Admitting: Cardiology

## 2022-11-17 VITALS — BP 122/68 | HR 90 | Ht 70.0 in | Wt 229.0 lb

## 2022-11-17 DIAGNOSIS — I10 Essential (primary) hypertension: Secondary | ICD-10-CM | POA: Insufficient documentation

## 2022-11-17 DIAGNOSIS — Z7984 Long term (current) use of oral hypoglycemic drugs: Secondary | ICD-10-CM

## 2022-11-17 DIAGNOSIS — I25118 Atherosclerotic heart disease of native coronary artery with other forms of angina pectoris: Secondary | ICD-10-CM | POA: Insufficient documentation

## 2022-11-17 DIAGNOSIS — R931 Abnormal findings on diagnostic imaging of heart and coronary circulation: Secondary | ICD-10-CM | POA: Insufficient documentation

## 2022-11-17 DIAGNOSIS — E7800 Pure hypercholesterolemia, unspecified: Secondary | ICD-10-CM | POA: Insufficient documentation

## 2022-11-17 DIAGNOSIS — E119 Type 2 diabetes mellitus without complications: Secondary | ICD-10-CM | POA: Insufficient documentation

## 2022-11-17 NOTE — Patient Instructions (Addendum)
Medication Instructions:   STOP: Niacin   Lab Work: Have Labs completed with PCP If you have labs (blood work) drawn today and your tests are completely normal, you will receive your results only by: MyChart Message (if you have MyChart) OR A paper copy in the mail If you have any lab test that is abnormal or we need to change your treatment, we will call you to review the results.   Testing/Procedures: None Ordered   Follow-Up: At [facility], you and your health needs are our priority.  As part of our continuing mission to provide you with exceptional heart care, we have created designated Provider Care Teams.  These Care Teams include your primary Cardiologist (physician) and Advanced Practice Providers (APPs -  Physician Assistants and Nurse Practitioners) who all work together to provide you with the care you need, when you need it.  We recommend signing up for the patient portal called "MyChart".  Sign up information is provided on this After Visit Summary.  MyChart is used to connect with patients for Virtual Visits (Telemedicine).  Patients are able to view lab/test results, encounter notes, upcoming appointments, etc.  Non-urgent messages can be sent to your provider as well.   To learn more about what you can do with MyChart, go to [email or web address].    Your next appointment:   12 month(s)  The format for your next appointment:   In Person  Provider:   Rosanne Sack, MD    Other Instructions Apple Watch information [place] [phone] Steps a day

## 2023-06-08 ENCOUNTER — Telehealth: Payer: Self-pay | Admitting: Cardiology

## 2023-06-08 NOTE — Telephone Encounter (Signed)
Patient is calling to talk with Dr. Dulce Sellar or nurse

## 2023-06-12 NOTE — Telephone Encounter (Signed)
Spoke to patient regarding Doctor offices around the area. Gave patient contact information. No further questions.

## 2023-07-18 ENCOUNTER — Encounter: Payer: Self-pay | Admitting: Physician Assistant

## 2023-08-06 NOTE — Progress Notes (Addendum)
 Assessment/Plan:     Jacob Patel is a very pleasant 83 y.o. year old RH male with a history of hypertension, hyperlipidemia, OSA on CPAP, CAD, DM2 with neuropathy, anxiety, B12 deficiency seen today for evaluation of memory loss. MoCA today is 22/30. Workup is in progress.  Of note, MRI of the brain in 2020, personally reviewed had revealed moderate chronic small vessel ischemic disease without other acute findings.  Will compare these results to any new imaging in an effort to rule out concerns for vascular etiology of his memory loss.  He is still able to perform his ADLs without difficulty, mood is good   Memory Impairment, concern for vascular etiology  MRI brain without contrast to assess for underlying structural abnormality and assess vascular load  Neurocognitive testing to further evaluate cognitive concerns and determine other underlying cause of memory changes, including potential contribution from sleep, anxiety, attention, or depression among others  Start memantine 5 mg twice daily as directed, side effects discussed.  (History of stool incontinence and intermittent diarrhea) Check B12, TSH Continue using CPAP for OSA Recommend good control of cardiovascular risk factors.   Continue to control mood as per PCP [place] up in 3 months  Subjective:    The patient is accompanied by his wife who supplements the history.    How long did patient have memory difficulties?  For about 15 years, with gradual changes.  He had initially been seen in June 2020 by [facility].  Then losing to follow-up.  Patient reports some difficulty remembering new information, recent conversations, names. LTM "not quite as bad, depending on the topic". He enjoys going to [place] and activities there.  repeats oneself?  Endorsed Disoriented when walking into a room?  Patient denies    Leaving objects in unusual places?  denies   Wandering behavior? denies   Any personality changes, or depression, anxiety?  "He is a little short sometimes, but not a problem yet"-wife says. Hallucinations or paranoia? denies   Seizures? denies    Any sleep changes?  Sleeps well. He always had vivid dreams, REM behavior or sleepwalking.   Sleep apneaEndorsed, uses a CPAP Any hygiene concerns?  Denies.   Independent of bathing and dressing? Endorsed  Does the patient need help with medications? Patient is in charge   Who is in charge of the finances?  Patient is in charge . "He almost fell victim of a scam, but he has always been very golable"-wife says   Any changes in appetite? "Got plenty of it"-increases.     Patient have trouble swallowing?  Denies.   Does the patient cook? No  Any headaches?  Denies.   Chronic pain?  He reports some arthritic pain, but this is not severe. Ambulates with difficulty? Denies  Recent falls or head injuries? Last fall was during a road trip while he was lifting a suitcase, no LOC, no head injury.      Vision changes?  Denies any new issues.  Needs glasses  Any strokelike symptoms? Denies.   Any tremors? Mild ET, familial, but not interfering with the ADLs.  Denies any other parkinsonian complaints Any anosmia? Denies.   Any incontinence of urine?  Urge and Stress incontinence, wears pads  Any bowel dysfunction?  Intermittent and stool incontinence during the last 2 years.  He is to see GI Patient lives with wife in a farm History of heavy alcohol intake? Denies.   History of heavy tobacco use? Denies.   Family  history of dementia?  Denies Does patient drive? Yes, but "I let her do most of it because my reaction time is down for the last 5 years".    Retired Naval architect  No Known Allergies  Current Outpatient Medications  Medication Instructions   amLODipine (NORVASC) 5 mg, Daily   aspirin EC 81 mg, Oral, Daily, Swallow whole.   carvedilol (COREG) 6.25 mg, 2 times daily with meals   dapagliflozin propanediol (FARXIGA) 10 mg, Daily   fexofenadine (ALLEGRA)  180 mg, Daily   lansoprazole (PREVACID) 30 mg, Every morning   lisinopril (ZESTRIL) 10 mg, Daily   memantine (NAMENDA) 5 MG tablet Take 1 tablet (5 mg at night) for 2 weeks, then increase to 1 tablet (5 mg) twice a day   metFORMIN (GLUCOPHAGE) 1,000 mg, Oral, Daily with breakfast   Niacinamide-Zn-Cu-Methfo-Se-Cr (NICOTINAMIDE PO) 1 tablet, 2 times daily   nitroGLYCERIN (NITROSTAT) 0.4 mg, Sublingual, Every 5 min PRN   rosuvastatin (CRESTOR) 40 MG tablet Oral, Daily at bedtime, Takes 20mg  daily   Semaglutide (OZEMPIC, 1 MG/DOSE, [place]) 1 mg, Weekly   Vitamin D 2,000 Units, Daily     VITALS:   Vitals:   08/11/23 0755  BP: 135/74  Pulse: 83  Resp: 20  SpO2: 95%  Weight: 226 lb (102.5 kg)      PHYSICAL EXAM   HEENT:  Normocephalic, atraumatic.  The superficial temporal arteries are without ropiness or tenderness. Cardiovascular: Regular rate and rhythm. Lungs: Clear to auscultation bilaterally. Neck: There are no carotid bruits noted bilaterally.  NEUROLOGICAL:     No data to display             11/22/2018    8:00 AM  MMSE - Mini Mental State Exam  Orientation to time 5  Orientation to Place 5  Registration 3  Attention/ Calculation 5  Recall 3  Language- name 2 objects 2  Language- repeat 1  Language- follow 3 step command 3  Language- read & follow direction 1  Write a sentence 1  Copy design 1  Total score 30     Orientation:  Alert and oriented to person, place and time.  No aphasia or dysarthria. Fund of knowledge is appropriate. Recent and remote memory impaired.  Attention and concentration are reduced .  Able to name objects and repeat phrases . Delayed recall 3/5 Cranial nerves: There is good facial symmetry. Extraocular muscles are intact and visual fields are full to confrontational testing. Speech is fluent and clear. No tongue deviation. Hearing is intact to conversational tone.    Tone: Tone is good throughout. Sensation: Sensation is intact to light  touch.  Vibration is intact at the bilateral big toe.  Coordination: The patient has no difficulty with RAM's or FNF bilaterally. Normal finger to nose  Motor: Strength is 5/5 in the bilateral upper and lower extremities. There is no pronator drift. There are no fasciculations noted. DTR's: Deep tendon reflexes are 2/4 bilaterally. Gait and Station: The patient is able to ambulate without difficulty, decreased arm swing . Gait is cautious and narrow. Stride length is normal        Thank you for allowing [place] the opportunity to participate in the care of this nice patient. Please do not hesitate to contact us for any questions or concerns.   Total time spent on today's visit was 60 minutes dedicated to this patient today, preparing to see patient, examining the patient, ordering tests and/or medications and counseling the patient, documenting clinical  information in the EHR or other health record, independently interpreting results and communicating results to the patient/family, discussing treatment and goals, answering patient's questions and coordinating care.  Cc:  Soundra Pilon, FNP  Marlowe Kays 08/11/2023 12:29 PM

## 2023-08-11 ENCOUNTER — Other Ambulatory Visit: Payer: Commercial Managed Care - PPO

## 2023-08-11 ENCOUNTER — Ambulatory Visit (INDEPENDENT_AMBULATORY_CARE_PROVIDER_SITE_OTHER): Payer: Commercial Managed Care - PPO | Admitting: Physician Assistant

## 2023-08-11 ENCOUNTER — Encounter: Payer: Self-pay | Admitting: Physician Assistant

## 2023-08-11 VITALS — BP 135/74 | HR 83 | Resp 20 | Wt 226.0 lb

## 2023-08-11 DIAGNOSIS — R413 Other amnesia: Secondary | ICD-10-CM

## 2023-08-11 MED ORDER — MEMANTINE HCL 5 MG PO TABS: ORAL_TABLET | ORAL | 3 refills | Status: DC

## 2023-08-11 NOTE — Patient Instructions (Addendum)
 It was a pleasure to see you today at our office.   Recommendations:  Neurocognitive evaluation at our office  MRI of the brain, the radiology office will call you to arrange you appointment  Check labs today  Keep using the CPAP       Start Memantine 5 mg: Take 1 tablet (5 mg at night) for 2 weeks, then increase to 1 tablet (5mg ) twice a day.    Follow up in 3 months Recommend visiting the website : " Dementia Success Path" to better understand some behaviors related to memory loss.    For psychiatric meds, mood meds: Please have your primary care physician manage these medications.  If you have any severe symptoms of a stroke, or other severe issues such as confusion,severe chills or fever, etc call 911 or go to the ER as you may need to be evaluated further     For assessment of decision of mental capacity and competency:  Call Dr. Erick Blinks, geriatric psychiatrist at [phone]  Counseling regarding caregiver distress, including caregiver depression, anxiety and issues regarding community resources, adult day care programs, adult living facilities, or memory care questions:  please contact your  Primary Doctor's Social Worker   Whom to call: Memory  decline, memory medications: Call our office [phone]    [email or web address]   RECOMMENDATIONS FOR ALL PATIENTS WITH MEMORY PROBLEMS: 1. Continue to exercise (Recommend 30 minutes of walking everyday, or 3 hours every week) 2. Increase social interactions - continue going to [place] and enjoy social gatherings with friends and family 3. Eat healthy, avoid fried foods and eat more fruits and vegetables 4. Maintain adequate blood pressure, blood sugar, and blood cholesterol level. Reducing the risk of stroke and cardiovascular disease also helps promoting better memory. 5. Avoid stressful situations. Live a simple life and avoid aggravations. Organize your time and prepare  for the next day in anticipation. 6. Sleep well, avoid any interruptions of sleep and avoid any distractions in the bedroom that may interfere with adequate sleep quality 7. Avoid sugar, avoid sweets as there is a strong link between excessive sugar intake, diabetes, and cognitive impairment We discussed the Mediterranean diet, which has been shown to help patients reduce the risk of progressive memory disorders and reduces cardiovascular risk. This includes eating fish, eat fruits and green leafy vegetables, nuts like almonds and hazelnuts, walnuts, and also use olive oil. Avoid fast foods and fried foods as much as possible. Avoid sweets and sugar as sugar use has been linked to worsening of memory function.  There is always a concern of gradual progression of memory problems. If this is the case, then we may need to adjust level of care according to patient needs. Support, both to the patient and caregiver, should then be put into place.      You have been referred for a neuropsychological evaluation (i.e., evaluation of memory and thinking abilities). Please bring someone with you to this appointment if possible, as it is helpful for the doctor to hear from both you and another adult who knows you well. Please bring eyeglasses and hearing aids if you wear them.    The evaluation will take approximately 3 hours and has two parts:   The first part is a clinical interview with the neuropsychologist (Dr. Milbert Coulter or Dr. Robbie Lis). During the interview, the neuropsychologist will speak with you and the individual you brought to the appointment.    The second part of the evaluation is testing with the  doctor's technician Annabelle Harman or Selena Batten). During the testing, the technician will ask you to remember different types of material, solve problems, and answer some questionnaires. Your family member will not be present for this portion of the evaluation.   Please note: We must reserve several hours of the  neuropsychologist's time and the psychometrician's time for your evaluation appointment. As such, there is a No-Show fee of $100. If you are unable to attend any of your appointments, please contact our office as soon as possible to reschedule.      DRIVING: Regarding driving, in patients with progressive memory problems, driving will be impaired. We advise to have someone else do the driving if trouble finding directions or if minor accidents are reported. Independent driving assessment is available to determine safety of driving.   If you are interested in the driving assessment, you can contact the following:  [facility] [employer] in [place] [phone]  [facility] [phone]  [facility] [phone]  [facility] [phone] or [phone]   FALL PRECAUTIONS: Be cautious when walking. Scan the area for obstacles that may increase the risk of trips and falls. When getting up in the mornings, sit up at the edge of the bed for a few minutes before getting out of bed. Consider elevating the bed at the head end to avoid drop of blood pressure when getting up. Walk always in a well-lit room (use night lights in the walls). Avoid area rugs or power cords from appliances in the middle of the walkways. Use a walker or a cane if necessary and consider physical therapy for balance exercise. Get your eyesight checked regularly.  FINANCIAL OVERSIGHT: Supervision, especially oversight when making financial decisions or transactions is also recommended.  HOME SAFETY: Consider the safety of the kitchen when operating appliances like stoves, microwave oven, and blender. Consider having supervision and share cooking responsibilities until no longer able to participate in those. Accidents with firearms and other hazards in the house should be identified and addressed as well.   ABILITY TO BE LEFT ALONE: If patient is unable to contact 911 operator, consider  using LifeLine, or when the need is there, arrange for someone to stay with patients. Smoking is a fire hazard, consider supervision or cessation. Risk of wandering should be assessed by caregiver and if detected at any point, supervision and safe proof recommendations should be instituted.  MEDICATION SUPERVISION: Inability to self-administer medication needs to be constantly addressed. Implement a mechanism to ensure safe administration of the medications.      Mediterranean Diet A Mediterranean diet refers to food and lifestyle choices that are based on the traditions of countries located on the [employer]. This way of eating has been shown to help prevent certain conditions and improve outcomes for people who have chronic diseases, like kidney disease and heart disease. What are tips for following this plan? Lifestyle  Cook and eat meals together with your family, when possible. Drink enough fluid to keep your urine clear or pale yellow. Be physically active every day. This includes: Aerobic exercise like running or swimming. Leisure activities like gardening, walking, or housework. Get 7-8 hours of sleep each night. If recommended by your health care provider, drink red wine in moderation. This means 1 glass a day for nonpregnant women and 2 glasses a day for men. A glass of wine equals 5 oz (150 mL). Reading food labels  Check the serving size of packaged foods. For foods such as rice and pasta,  the serving size refers to the amount of cooked product, not dry. Check the total fat in packaged foods. Avoid foods that have saturated fat or trans fats. Check the ingredients list for added sugars, such as corn syrup. Shopping  At the grocery store, buy most of your food from the areas near the walls of the store. This includes: Fresh fruits and vegetables (produce). Grains, beans, nuts, and seeds. Some of these may be available in unpackaged forms or large amounts (in bulk). Fresh  seafood. Poultry and eggs. Low-fat dairy products. Buy whole ingredients instead of prepackaged foods. Buy fresh fruits and vegetables in-season from local farmers markets. Buy frozen fruits and vegetables in resealable bags. If you do not have access to quality fresh seafood, buy precooked frozen shrimp or canned fish, such as tuna, salmon, or sardines. Buy small amounts of raw or cooked vegetables, salads, or olives from the deli or salad bar at your store. Stock your pantry so you always have certain foods on hand, such as olive oil, canned tuna, canned tomatoes, rice, pasta, and beans. Cooking  Cook foods with extra-virgin olive oil instead of using butter or other vegetable oils. Have meat as a side dish, and have vegetables or grains as your main dish. This means having meat in small portions or adding small amounts of meat to foods like pasta or stew. Use beans or vegetables instead of meat in common dishes like chili or lasagna. Experiment with different cooking methods. Try roasting or broiling vegetables instead of steaming or sauteing them. Add frozen vegetables to soups, stews, pasta, or rice. Add nuts or seeds for added healthy fat at each meal. You can add these to yogurt, salads, or vegetable dishes. Marinate fish or vegetables using olive oil, lemon juice, garlic, and fresh herbs. Meal planning  Plan to eat 1 vegetarian meal one day each week. Try to work up to 2 vegetarian meals, if possible. Eat seafood 2 or more times a week. Have healthy snacks readily available, such as: Vegetable sticks with hummus. Greek yogurt. Fruit and nut trail mix. Eat balanced meals throughout the week. This includes: Fruit: 2-3 servings a day Vegetables: 4-5 servings a day Low-fat dairy: 2 servings a day Fish, poultry, or lean meat: 1 serving a day Beans and legumes: 2 or more servings a week Nuts and seeds: 1-2 servings a day Whole grains: 6-8 servings a day Extra-virgin olive oil: 3-4  servings a day Limit red meat and sweets to only a few servings a month What are my food choices? Mediterranean diet Recommended Grains: Whole-grain pasta. Brown rice. Bulgar wheat. Polenta. Couscous. Whole-wheat bread. Orpah Cobb. Vegetables: Artichokes. Beets. Broccoli. Cabbage. Carrots. Eggplant. Green beans. Chard. Kale. Spinach. Onions. Leeks. Peas. Squash. Tomatoes. Peppers. Radishes. Fruits: Apples. Apricots. Avocado. Berries. Bananas. Cherries. Dates. Figs. Grapes. Lemons. Melon. Oranges. Peaches. Plums. Pomegranate. Meats and other protein foods: Beans. Almonds. Sunflower seeds. Pine nuts. Peanuts. Cod. Salmon. Scallops. Shrimp. Tuna. Tilapia. Clams. Oysters. Eggs. Dairy: Low-fat milk. Cheese. Greek yogurt. Beverages: Water. Red wine. Herbal tea. Fats and oils: Extra virgin olive oil. Avocado oil. Grape seed oil. Sweets and desserts: [place] yogurt with honey. Baked apples. Poached pears. Trail mix. Seasoning and other foods: Basil. Cilantro. Coriander. Cumin. Mint. Parsley. Sage. Rosemary. Tarragon. Garlic. Oregano. Thyme. Pepper. Balsalmic vinegar. Tahini. Hummus. Tomato sauce. Olives. Mushrooms. Limit these Grains: Prepackaged pasta or rice dishes. Prepackaged cereal with added sugar. Vegetables: Deep fried potatoes (french fries). Fruits: Fruit canned in syrup. Meats and other protein foods:  Beef. Pork. Lamb. Poultry with skin. Hot dogs. Tomasa Blase. Dairy: Ice cream. Sour cream. Whole milk. Beverages: Juice. Sugar-sweetened soft drinks. Beer. Liquor and spirits. Fats and oils: Butter. Canola oil. Vegetable oil. Beef fat (tallow). Lard. Sweets and desserts: Cookies. Cakes. Pies. Candy. Seasoning and other foods: Mayonnaise. Premade sauces and marinades. The items listed may not be a complete list. Talk with your dietitian about what dietary choices are right for you. Summary The Mediterranean diet includes both food and lifestyle choices. Eat a variety of fresh fruits and  vegetables, beans, nuts, seeds, and whole grains. Limit the amount of red meat and sweets that you eat. Talk with your health care provider about whether it is safe for you to drink red wine in moderation. This means 1 glass a day for nonpregnant women and 2 glasses a day for men. A glass of wine equals 5 oz (150 mL). This information is not intended to replace advice given to you by your health care provider. Make sure you discuss any questions you have with your health care provider. Document Released: 01/21/2016 Document Revised: 02/23/2016 Document Reviewed: 01/21/2016 Elsevier Interactive Patient Education  2017 [employer].

## 2023-08-12 LAB — VITAMIN B12: Vitamin B-12: 818 pg/mL (ref 200–1100)

## 2023-08-12 LAB — TSH: TSH: 3.09 m[IU]/L (ref 0.40–4.50)

## 2023-08-12 NOTE — Progress Notes (Signed)
Thyroid and B12 are normal, thanks

## 2023-09-07 ENCOUNTER — Other Ambulatory Visit: Payer: Self-pay | Admitting: Physician Assistant

## 2023-09-08 ENCOUNTER — Other Ambulatory Visit: Payer: Self-pay | Admitting: Physician Assistant

## 2023-09-11 ENCOUNTER — Telehealth: Payer: Self-pay

## 2023-09-11 NOTE — Telephone Encounter (Signed)
 Patient has been notified multiple attempts to schedule Mri brain wo contrast, He told [facility] he would call back to do MRI, Just FYI.

## 2023-09-21 ENCOUNTER — Institutional Professional Consult (permissible substitution): Payer: Commercial Managed Care - PPO | Admitting: Psychology

## 2023-09-21 ENCOUNTER — Ambulatory Visit: Payer: Self-pay

## 2023-09-28 ENCOUNTER — Encounter: Payer: Commercial Managed Care - PPO | Admitting: Psychology

## 2023-10-02 ENCOUNTER — Ambulatory Visit

## 2023-10-04 ENCOUNTER — Encounter: Payer: Self-pay | Admitting: Psychology

## 2023-10-05 ENCOUNTER — Ambulatory Visit: Payer: Self-pay | Admitting: Psychology

## 2023-10-05 ENCOUNTER — Institutional Professional Consult (permissible substitution): Payer: Self-pay | Admitting: Psychology

## 2023-10-05 ENCOUNTER — Encounter: Payer: Self-pay | Admitting: Psychology

## 2023-10-05 ENCOUNTER — Ambulatory Visit (INDEPENDENT_AMBULATORY_CARE_PROVIDER_SITE_OTHER): Admitting: Psychology

## 2023-10-05 ENCOUNTER — Other Ambulatory Visit: Payer: Self-pay

## 2023-10-05 DIAGNOSIS — I679 Cerebrovascular disease, unspecified: Secondary | ICD-10-CM

## 2023-10-05 DIAGNOSIS — R251 Tremor, unspecified: Secondary | ICD-10-CM | POA: Diagnosis not present

## 2023-10-05 DIAGNOSIS — R4189 Other symptoms and signs involving cognitive functions and awareness: Secondary | ICD-10-CM

## 2023-10-05 DIAGNOSIS — I999 Unspecified disorder of circulatory system: Secondary | ICD-10-CM

## 2023-10-05 DIAGNOSIS — F0670 Mild neurocognitive disorder due to known physiological condition without behavioral disturbance: Secondary | ICD-10-CM | POA: Diagnosis not present

## 2023-10-05 HISTORY — DX: Mild neurocognitive disorder due to known physiological condition without behavioral disturbance: F06.70

## 2023-10-05 MED ORDER — MEMANTINE HCL 5 MG PO TABS: ORAL_TABLET | ORAL | 1 refills | Status: DC

## 2023-10-05 NOTE — Progress Notes (Signed)
   Psychometrician Note   Cognitive testing was administered to Jacob Patel by Arnulfo Larch, B.S. (psychometrist) under the supervision of Dr. Melville Stade, Ph.D., ABPP, licensed psychologist on 10/05/2023. Jacob Patel did not appear overtly distressed by the testing session per behavioral observation or responses across self-report questionnaires. Rest breaks were offered.    The battery of tests administered was selected by Dr. Zachary C. Merz, Ph.D., ABPP with consideration to Jacob Patel's current level of functioning, the nature of his symptoms, emotional and behavioral responses during interview, level of literacy, observed level of motivation/effort, and the nature of the referral question. This battery was communicated to the psychometrist. Communication between Dr. Melville Stade, Ph.D., ABPP and the psychometrist was ongoing throughout the evaluation and Dr. Melville Stade, Ph.D., ABPP was immediately accessible at all times. Dr. Zachary C. Merz, Ph.D., ABPP provided supervision to the psychometrist on the date of this service to the extent necessary to assure the quality of all services provided.    Jacob Patel will return within approximately 1-2 weeks for an interactive feedback session with [name] at which time his test performances, clinical impressions, and treatment recommendations will be reviewed in detail. Jacob Patel understands he can contact our office should he require our assistance before this time.  A total of 140 minutes of billable time were spent face-to-face with Jacob Patel by the psychometrist. This includes both test administration and scoring time. Billing for these services is reflected in the clinical report generated by Dr. Melville Stade, Ph.D., ABPP  This note reflects time spent with the psychometrician and does not include test scores or any clinical interpretations made by [name]. The full report will follow in a separate note.

## 2023-10-05 NOTE — Progress Notes (Unsigned)
 NEUROPSYCHOLOGICAL EVALUATION [name] Department of Neurology  Date of Evaluation: October 05, 2023  Reason for Referral:   Jacob Patel is a 83 y.o. right-handed Caucasian male referred by Tex Filbert, PA-C, to characterize his current cognitive functioning and assist with diagnostic clarity and treatment planning in the context of subjective cognitive decline.   Assessment and Plan:   Clinical Impression(s): Jacob Patel pattern of performance is suggestive of performance variability surrounding verbal fluency (semantic worse than phonemic) and encoding (i.e., learning) aspects of memory. No consistent cognitive impairments were exhibited. Performances were appropriate relative to age-matched peers across processing speed, attention/concentration, executive functioning, safety/judgment, receptive language, confrontation naming, visuospatial abilities, and both delayed retrieval and recognition/consolidation aspects of memory. Functionally, Jacob Patel largely denied difficulties completing instrumental activities of daily living (ADLs) independently. His wife did report some navigational concerns while driving and him benefiting from reminders to take his medications on time. 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 However, the mild nature of this diagnosis should be emphasized at the present time.   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 This, when combined with Jacob Patel history of various chronic medical ailments, represents the most likely culprit for weakness/variability across isolated areas of testing. A history of essential tremor could also reasonably contribute to current testing patterns. Specific to memory, Jacob Patel was able to consistently retain previously learned information after  lengthy delays. Overall, memory performance combined with intact performances across other areas of cognitive functioning is not suggestive of presently symptomatic Alzheimer's disease. Likewise, his cognitive and behavioral profile is not suggestive of any other form of neurodegenerative illness presently.  Recommendations: I would encourage him to schedule and complete an updated brain MRI as previously ordered by Ms. Wertman. This would allow for a better understanding of potential progression of cerebrovascular disease over time.   Performance across neurocognitive testing is not a strong predictor of an individual's safety operating a motor vehicle. Should his family wish to pursue a formalized driving evaluation, they could reach out to the following agencies: [employer] in [place]: [phone] [facility]: [phone] [facility]: [phone] [facility]: [phone] or [phone]  Should there be progression of current deficits over time, Jacob Patel is unlikely to regain any independent living skills lost. Therefore, it is recommended that he remain as involved as possible in all aspects of household chores, finances, and medication management, with supervision to ensure adequate performance. He will likely benefit from the establishment and maintenance of a routine in order to maximize his functional abilities over time.  If not already done, Jacob Patel and his family may want to discuss his wishes regarding durable power of attorney and medical decision making, so that he can have input into these choices. If they require legal assistance with this, long-term care resource access, or other aspects of estate planning, they could reach out to [employer] at [phone] for a free consultation.   Jacob Patel is encouraged to attend to lifestyle factors for brain health (e.g., regular physical exercise, good nutrition habits and  consideration of the MIND-DASH diet, regular participation in cognitively-stimulating activities, and general stress management techniques), which are likely to have benefits for both emotional adjustment and cognition. Optimal control of vascular risk factors (including safe cardiovascular exercise and adherence to dietary recommendations) is encouraged. Likewise, continued compliance  with his CPAP machine will also be important. Continued participation in activities which provide mental stimulation and social interaction is also recommended.   Memory can be improved using internal strategies such as rehearsal, repetition, chunking, mnemonics, association, and imagery. External strategies such as written notes in a consistently used memory journal, visual and nonverbal auditory cues such as a calendar on the refrigerator or appointments with alarm, such as on a cell phone, can also help maximize recall.    When learning new information, he would benefit from information being broken up into small, manageable pieces. he may also find it helpful to articulate the material in his own words and in a context to promote encoding at the onset of a new task. This material may need to be repeated multiple times to promote encoding.  Because he shows better recall for structured information, he will likely understand and retain new information better if it is presented to him in a meaningful or well-organized manner at the outset, such as grouping items into meaningful categories or presenting information in an outlined, bulleted, or story format.  To address problems with fluctuating attention and/or executive dysfunction, he may wish to consider:   -Avoiding external distractions when needing to concentrate   -Limiting exposure to fast paced environments with multiple sensory demands   -Writing down complicated information and using checklists   -Attempting and completing one task at a time (i.e., no  multi-tasking)   -Verbalizing aloud each step of a task to maintain focus   -Taking frequent breaks during the completion of steps/tasks to avoid fatigue   -Reducing the amount of information considered at one time   -Scheduling more difficult activities for a time of day where he is usually most alert  Review of Records:   Mr. Ghan was seen by [facility] Phebe Brasil, M.D.) on 11/22/2018 for an evaluation of memory loss. At that time, memory difficulties surrounded trouble recalling names and making wrong turns while driving. He expressed concern surrounding progressive memory decline over the past few years. No functional concerns were noted. Performance on a brief cognitive screening instrument (MMSE) was 30/30. Unfortunately, he appeared lost to follow-up after this encounter.  He was next seen by [facility] Tex Filbert, PA-C) on 08/11/2023. Similar memory concerns were expressed, along with concern surrounding continued progressive decline over time. ADL dysfunction was largely denied. However, he had been driving less lately due to concern surrounding diminished reaction time. Performance on a brief cognitive screening instrument (MOCA) was 22/30. Ultimately, Mr. Riera was referred for a comprehensive neuropsychological evaluation to characterize his cognitive abilities and to assist with diagnostic clarity and treatment planning.   Neuroimaging: Brain MRI on 12/26/2018 suggested moderate microvascular ischemic disease but was otherwise unremarkable. An updated MRI was had been ordered but not yet scheduled at the time of the current writing.   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   Benign essential hypertension    Disease of esophagus, unspecified    Ingrown nail 01/04/2018   Leg edema, right 05/15/2018   Obstructive sleep apnea 03/22/2021   Osteoarthritis    Pure  hypercholesterolemia    Tremor    Type 2 diabetes mellitus without complication, without long-term current use of insulin 06/11/2019    Past Surgical History:  Procedure Laterality Date   CARPAL TUNNEL RELEASE Right    HEMORRHOID SURGERY     KNEE ARTHROSCOPY  Left    POLYPECTOMY     ROTATOR CUFF REPAIR Left    VASECTOMY      Current Outpatient Medications:    amLODipine (NORVASC) 5 MG tablet, Take 5 mg by mouth daily., Disp: , Rfl:    aspirin  EC 81 MG tablet, Take 1 tablet (81 mg total) by mouth daily. Swallow whole., Disp: 90 tablet, Rfl: 3   carvedilol (COREG) 6.25 MG tablet, Take 6.25 mg by mouth 2 (two) times daily with a meal., Disp: , Rfl:    Cholecalciferol (VITAMIN D) 50 MCG (2000 UT) CAPS, Take 2,000 Units by mouth daily., Disp: , Rfl:    dapagliflozin propanediol (FARXIGA) 10 MG TABS tablet, Take 10 mg by mouth daily., Disp: , Rfl:    fexofenadine (ALLEGRA) 180 MG tablet, Take 180 mg by mouth daily., Disp: , Rfl:    lansoprazole (PREVACID) 30 MG capsule, Take 30 mg by mouth in the morning., Disp: , Rfl:    lisinopril (ZESTRIL) 10 MG tablet, Take 10 mg by mouth daily., Disp: , Rfl:    memantine  (NAMENDA ) 5 MG tablet, TAKE 1 TABLET BY MOUTH AT NIGHT FOR 2 WEEKS THEN INCREASE TO 1 TABLET TWICE A DAY, Disp: 180 tablet, Rfl: 3   metFORMIN (GLUCOPHAGE) 1000 MG tablet, Take 1,000 mg by mouth daily with breakfast., Disp: , Rfl:    Niacinamide-Zn-Cu-Methfo-Se-Cr (NICOTINAMIDE PO), Take 1 tablet by mouth 2 (two) times daily. Strength unknown (Patient not taking: Reported on 08/11/2023), Disp: , Rfl:    nitroGLYCERIN  (NITROSTAT ) 0.4 MG SL tablet, Place 1 tablet (0.4 mg total) under the tongue every 5 (five) minutes as needed., Disp: 25 tablet, Rfl: 6   rosuvastatin (CRESTOR) 40 MG tablet, Take by mouth at bedtime. Takes 20mg  daily, Disp: , Rfl:    Semaglutide (OZEMPIC, 1 MG/DOSE, [place]), Inject 1 mg into the skin once a week., Disp: , Rfl:   Clinical Interview:   The following information  was obtained during a clinical interview with Mr. Talavera and his wife prior to cognitive testing.  Cognitive Symptoms: Decreased short-term memory: Endorsed. He described generalized forgetfulness. With direct questioning, he reported trouble recalling novel information such as new names or details of recent conversations. His wife was in agreement. They both expressed concern that memory had exhibited progressive decline over the past 2-3 years.  Decreased long-term memory: Denied. Decreased attention/concentration: Endorsed. He reported some mild difficulty with sustained attention and distractibility.  Reduced processing speed: Endorsed. Difficulties with executive functions: Endorsed. He reported some mild difficulty with organization, multi-tasking, and decision making.  Difficulties with emotion regulation: Denied. His wife denied trouble with impulsivity or any prominent personality changes.  Difficulties with receptive language: Denied. Difficulties with word finding: Endorsed. Decreased visuoperceptual ability: Denied.  Difficulties completing ADLs: Largely denied. He is generally independent with medication management. His wife does look over his shoulder to ensure accuracy and may need to provide occasional reminders. His wife manages finances. She took this over a few years prior but stated that this was not due to suspected cognitive dysfunction. Mr. Pfeifer drives in a limited fashion in local and familiar locations. He has diminished driving over time due to suspected declines in reaction time.   Additional Medical History: History of traumatic brain injury/concussion: Denied. History of stroke: Denied. History of seizure activity: Denied. History of known exposure to toxins: Denied. Symptoms of chronic pain: Denied. Experience of frequent headaches/migraines: Denied. Frequent instances of dizziness/vertigo: Denied.  Sensory changes: He wears glasses with benefit. Other sensory  changes/difficulties (  e.g., hearing, taste, smell) were not reported.  Balance/coordination difficulties: He described his balance as "not nearly as good as it used to be." There was report of a fall this past February due to him tripping over a suitcase in his path. Outside of this, his wife did not report frequent falling behaviors. One side of the body was not said to be less stable relative to the other.  Other motor difficulties: He has a history of bilateral upper extremity tremor, thought to represent essential tremor. Tremors are generally action-based. However, his wife did report some instances of a resting tremor in his right hand which Mr. Mathew seems unaware of.   Sleep History: Estimated hours obtained each night: 8 hours.  Difficulties falling asleep: Denied. Difficulties staying asleep: Denied. Feels rested and refreshed upon awakening: Endorsed.  History of snoring: Endorsed. History of waking up gasping for air: Endorsed. Witnessed breath cessation while asleep: Endorsed. He has been diagnosed with obstructive sleep apnea and uses his CPAP machine nightly.   History of vivid dreaming: Endorsed. Excessive movement while asleep: Denied. Instances of acting out his dreams: Denied.  Psychiatric/Behavioral Health History: Depression: He described his current mood as positive and denied to his knowledge any prior mental health concerns or formal diagnoses. Current or remote suicidal ideation, intent, or plan was denied.  Anxiety: Denied. Mania: Denied. Trauma History: Denied. Visual/auditory hallucinations: Denied. Delusional thoughts: Denied.  Tobacco: Denied. Alcohol: He denied current alcohol consumption as well as a history of problematic alcohol abuse or dependence.  Recreational drugs: Denied.  Family History: Problem Relation Age of Onset   CAD Mother    Heart Problems Mother    Tremor Mother    Tremor Father    CAD Father    GI problems Father    Congestive  Heart Failure Father 32   CAD Brother    Valvular heart disease Brother        Valve replacement   Coronary artery disease Brother    GI problems Brother    Cancer Maternal Aunt    Testicular cancer Paternal Uncle    This information was confirmed by Mr. Ackerman.  Academic/Vocational History: Highest level of educational attainment: 16 years. He graduated from [employer] school and earned a Oncologist in Merchandiser, retail from [employer]. He described himself as a good (A/B) student in academic settings. No relative weaknesses were identified.  History of developmental delay: Denied. History of grade repetition: Denied. Enrollment in special education courses: Denied. History of LD/ADHD: Denied.  Employment: Retired.  Evaluation Results:   Behavioral Observations: Mr. Croft was accompanied by his wife, arrived to his appointment on time, and was appropriately dressed and groomed. He appeared alert. Observed gait and station were within normal limits. Gross motor functioning appeared intact upon informal observation and no abnormal movements (e.g., tremors) were noted. His affect was generally relaxed and positive. Spontaneous speech was fluent and word finding difficulties were not observed during the clinical interview. Thought processes were coherent, organized, and normal in content. Insight into his cognitive difficulties appeared adequate.   During testing, sustained attention was appropriate. Task engagement was adequate and he persisted when challenged. Overall, Mr. Sandt was cooperative with the clinical interview and subsequent testing procedures.   Adequacy of Effort: The validity of neuropsychological testing is limited by the extent to which the individual being tested may be assumed to have exerted adequate effort during testing. Mr. Tafoya expressed his intention to perform to the best of  his abilities and exhibited adequate task engagement and  persistence. Scores across stand-alone and embedded performance validity measures were within expectation. As such, the results of the current evaluation are believed to be a valid representation of Mr. Tomich current cognitive functioning.  Test Results: Mr. Salmons was oriented at the time of the current evaluation.  Intellectual abilities based upon educational and vocational attainment were estimated to be in the average range. Premorbid abilities were estimated to be within the average range based upon a single-word reading test.   Processing speed was below average to average. Basic attention was above average to well above average. More complex attention (e.g., working memory) was average. Executive functioning was below average to average outside of an exceptionally low performance across a semantic fluency based set shifting task. He performed in the average range across a task assessing safety and judgment.  Assessed receptive language abilities were above average. Likewise, Mr. Veasey did not exhibit any difficulties comprehending task instructions and answered all questions asked of him appropriately. Assessed expressive language was variable. Phonemic fluency was well below average to average, semantic fluency was exceptionally low to below average, and confrontation naming was above average to well above average.   Assessed visuospatial/visuoconstructional abilities were mildly variable but overall appropriate, ranging from the below average to above average normative ranges.    Learning (i.e., encoding) of novel verbal information was variable, ranging from the well below average to average normative ranges. Spontaneous delayed recall (i.e., retrieval) of previously learned information was average. Performance across recognition tasks was average to above average, suggesting evidence for information consolidation.   Results of emotional screening instruments suggested that recent symptoms  of generalized anxiety were in the mild range, while symptoms of depression were also within the mild range. A screening instrument assessing recent sleep quality suggested the presence of minimal sleep dysfunction.  Tables of Scores:   Note: This summary of test scores accompanies the interpretive report and should not be considered in isolation without reference to the appropriate sections in the text. Descriptors are based on appropriate normative data and may be adjusted based on clinical judgment. Terms such as "Within Normal Limits" and "Outside Normal Limits" are used when a more specific description of the test score cannot be determined.       Percentile - Normative Descriptor > 98 - Exceptionally High 91-97 - Well Above Average 75-90 - Above Average 25-74 - Average 9-24 - Below Average 2-8 - Well Below Average < 2 - Exceptionally Low       Validity:   DESCRIPTOR       DCT: --- --- Within Normal Limits  RBANS EI: --- --- Within Normal Limits       Orientation:      Raw Score Percentile   NAB Orientation, Form 1 28/29 --- ---       Cognitive Screening:      Raw Score Percentile   SLUMS: 20/30 --- ---       RBANS, Form A: Standard Score/ Scaled Score Percentile   Total Score 87 19 Below Average  Immediate Memory 85 16 Below Average    List Learning 10 50 Average    Story Memory 5 5 Well Below Average  Visuospatial/Constructional 96 39 Average    Figure Copy 12 75 Above Average    Line Orientation 13/20 10-16 Below Average  Language 76 5 Well Below Average    Picture Naming 10/10 >75 Above Average    Semantic Fluency 1 <1  Exceptionally Low  Attention 97 42 Average    Digit Span 13 84 Above Average    Coding 6 9 Below Average  Delayed Memory 104 61 Average    List Recall 3/10 26-50 Average    List Recognition 20/20 51-75 Average    Story Recall 10 50 Average    Story Recognition 12/12 87+ Above Average    Figure Recall 11 63 Average    Figure Recognition 7/8  69-83 Average to  Above Average        Intellectual Functioning:      Standard Score Percentile   Test of Premorbid Functioning: 98 45 Average       Attention/Executive Function:     Trail Making Test (TMT): Raw Score (T Score) Percentile     Part A 36 secs.,  0 errors (51) 54 Average    Part B 165 secs.,  2 errors (39) 14 Below Average         Scaled Score Percentile   WAIS-5 Coding: 8 25 Average  WAIS-5 Naming Speed Quantity: 10 50 Average        Scaled Score Percentile   WAIS-5 Digits Forwards: 14 91 Well Above Average  WAIS-5 Digit Sequencing: 10 50 Average        Scaled Score Percentile   WAIS-5 Similarities: 9 37 Average  WAIS-5 Figure Weights: 11 63 Average       D-KEFS Verbal Fluency Test: Raw Score (Scaled Score) Percentile     Letter Total Correct 25 (8) 25 Average    Category Total Correct 21 (6) 9 Below Average    Category Switching Total Correct 4 (2) <1 Exceptionally Low    Category Switching Accuracy 2 (3) 1 Exceptionally Low      Total Set Loss Errors 2 (10) 50 Average      Total Repetition Errors 3 (10) 50 Average       NAB Executive Functions Module, Form 1: T Score Percentile     Judgment 54 66 Average       Language:     Verbal Fluency Test: Raw Score (T Score) Percentile     Phonemic Fluency (FAS) 25 (35) 7 Well Below Average    Animal Fluency 10 (29) 2 Exceptionally Low        NAB Language Module, Form 1: T Score Percentile     Auditory Comprehension 57 75 Above Average    Naming 31/31 (63) 91 Well Above Average       Visuospatial/Visuoconstruction:      Raw Score Percentile   Clock Drawing: 10/10 --- Within Normal Limits        Scaled Score Percentile   WAIS-5 Block Design: 13 84 Above Average       Mood and Personality:      Raw Score Percentile   Geriatric Depression Scale: 14 --- Mild  Geriatric Anxiety Scale: 16 --- Mild    Somatic 4 --- Minimal    Cognitive 5 --- Mild    Affective 7 --- Moderate       Additional  Questionnaires:      Raw Score Percentile   PROMIS Sleep Disturbance Questionnaire: 10 --- None to Slight   Informed Consent and Coding/Compliance:   The current evaluation represents a clinical evaluation for the purposes previously outlined by the referral source and is in no way reflective of a forensic evaluation.   Mr. Ulysse was provided with a verbal description of the nature and purpose of the present neuropsychological evaluation. Also  reviewed were the foreseeable risks and/or discomforts and benefits of the procedure, limits of confidentiality, and mandatory reporting requirements of this provider. The patient was given the opportunity to ask questions and receive answers about the evaluation. Oral consent to participate was provided by the patient.   This evaluation was conducted by Melville Stade, Ph.D., ABPP-CN, board certified clinical neuropsychologist. Mr. Mittag completed a clinical interview with Dr. Kitty Perkins, billed as one unit [phone], and 140 minutes of cognitive testing and scoring, billed as one unit [phone] and four additional units [id number]. Psychometrist Arnulfo Larch, B.S. assisted Dr. Kitty Perkins with test administration and scoring procedures. As a separate and discrete service, one unit [id number] and two units [phone] were billed for Dr. Arsenio Larger time spent in interpretation and report writing.

## 2023-10-06 ENCOUNTER — Encounter: Payer: Self-pay | Admitting: Psychology

## 2023-10-06 DIAGNOSIS — I679 Cerebrovascular disease, unspecified: Secondary | ICD-10-CM | POA: Insufficient documentation

## 2023-10-10 ENCOUNTER — Other Ambulatory Visit: Payer: Self-pay | Admitting: Physician Assistant

## 2023-10-10 ENCOUNTER — Telehealth: Payer: Self-pay

## 2023-10-10 MED ORDER — MEMANTINE HCL 5 MG PO TABS: ORAL_TABLET | ORAL | 1 refills | Status: AC

## 2023-10-10 NOTE — Telephone Encounter (Signed)
 Refill request received for Memantine  5 mg

## 2023-10-12 ENCOUNTER — Ambulatory Visit (INDEPENDENT_AMBULATORY_CARE_PROVIDER_SITE_OTHER): Admitting: Psychology

## 2023-10-12 DIAGNOSIS — F0670 Mild neurocognitive disorder due to known physiological condition without behavioral disturbance: Secondary | ICD-10-CM

## 2023-10-12 DIAGNOSIS — I679 Cerebrovascular disease, unspecified: Secondary | ICD-10-CM | POA: Diagnosis not present

## 2023-10-12 DIAGNOSIS — I999 Unspecified disorder of circulatory system: Secondary | ICD-10-CM | POA: Diagnosis not present

## 2023-10-12 DIAGNOSIS — R251 Tremor, unspecified: Secondary | ICD-10-CM

## 2023-10-12 NOTE — Progress Notes (Signed)
   Neuropsychology Feedback Session Jacob Patel. [facility] [place] Department of Neurology  Reason for Referral:   Jacob Patel is a 83 y.o. right-handed Caucasian male referred by Tex Filbert, PA-C, to characterize his current cognitive functioning and assist with diagnostic clarity and treatment planning in the context of subjective cognitive decline.   Feedback:   [name] completed a comprehensive neuropsychological evaluation on 10/05/2023. Please refer to that encounter for the full report and recommendations. Briefly, results suggested performance variability surrounding verbal fluency (semantic worse than phonemic) and encoding (i.e., learning) aspects of memory. No consistent cognitive impairments were exhibited. 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 This, when combined with [name] history of various chronic medical ailments, represents the most likely culprit for weakness/variability across isolated areas of testing. A history of essential tremor could also reasonably contribute to current testing patterns. Specific to memory, [name] was able to consistently retain previously learned information after lengthy delays. Overall, memory performance combined with intact performances across other areas of cognitive functioning is not suggestive of presently symptomatic Alzheimer's disease.   [name] was accompanied by his wife during the current feedback session. Content of the current session focused on the results of his neuropsychological evaluation. [name] was given the opportunity to ask questions and his questions were answered. He was encouraged to reach out should additional questions arise. A copy of his report was provided at the conclusion of the visit.      One unit [phone] was billed for Dr. Arsenio Larger time spent preparing for, conducting, and documenting the current feedback session with [name].

## 2023-10-26 ENCOUNTER — Ambulatory Visit
Admission: RE | Admit: 2023-10-26 | Discharge: 2023-10-26 | Disposition: A | Discharge status: Home / Self Care | Source: Ambulatory Visit | Attending: Physician Assistant | Admitting: Physician Assistant

## 2023-10-27 ENCOUNTER — Ambulatory Visit: Payer: Self-pay | Admitting: Physician Assistant

## 2023-11-08 ENCOUNTER — Ambulatory Visit (INDEPENDENT_AMBULATORY_CARE_PROVIDER_SITE_OTHER): Payer: Commercial Managed Care - PPO | Admitting: Physician Assistant

## 2023-11-08 ENCOUNTER — Encounter: Payer: Self-pay | Admitting: Physician Assistant

## 2023-11-08 VITALS — BP 116/72 | HR 76 | Resp 20

## 2023-11-08 DIAGNOSIS — F0670 Mild neurocognitive disorder due to known physiological condition without behavioral disturbance: Secondary | ICD-10-CM

## 2023-11-08 DIAGNOSIS — I999 Unspecified disorder of circulatory system: Secondary | ICD-10-CM | POA: Diagnosis not present

## 2023-11-08 NOTE — Progress Notes (Signed)
 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 Patient is on memantine  5 mg twice daily, tolerating well.Patient is able to participate on ADLs and to drive without difficulties     Recommendations:   Follow up in 6  months. Continue memantine  5 mg twice daily, side effects discussed (history of stool incontinence and intermittent diarrhea) Continue with CPAP for OSA Recommend good control of cardiovascular risk factors Continue to control mood as per PCP    Subjective:   This patient is accompanied in the office by his wife who supplements the history. Previous records as well as any outside records available were reviewed prior to todays visit.   Patient was last seen on 08/11/2023 with [facility] 22/30 .    Any changes in memory since last visit? " About the same"." When he starts the thought process he stops before completing it. He was trying to jump the car and forgot what the next step was and kept looking at the charger"-wife says.  repeats oneself?  Endorsed Disoriented when walking into a room?  Patient denies    Misplacing objects?  Patient denies   Wandering behavior?   Denies. Any personality changes since last visit? Denies.   Any worsening depression?: denies.   Hallucinations or paranoia?  Denies.   Seizures?   Denies.    Any sleep changes? Sleeps well.  Denies vivid dreams, REM behavior or sleepwalking   Sleep apnea? Endorsed, uses the CPAP  Any hygiene concerns?   Denies.   Independent of bathing and dressing?  Endorsed   Does the patient needs help with medications? Patient is in charge   Who is in charge of the finances?  Patient is in charge      Any changes in appetite?  denies    Patient have trouble swallowing?  Denies.   Does the patient cook? No  Any kitchen accidents such as leaving the stove on?   Denies.   Any headaches?    Denies.   Vision changes? Denies. Chronic pain?  Denies.   Ambulates with difficulty?    Denies.    Recent falls or head injuries?    Denies.      Unilateral weakness, numbness or tingling?  Denies.   Any tremors? Not worse. Mild ET, familial, but not interfering with ADLs.  Any anosmia?    Denies.   Any incontinence of urine?  Denies.   Any bowel dysfunction?  Has a history of stool incontinence and intermittent diarrhea      Patient lives with his wife . Does the patient drive? Very little, very short distances    Initial visit 08/11/2023 How long did patient have memory difficulties?  For about 15 years, with gradual changes.  He had initially been seen in June 2020 by [facility].  Then losing to follow-up.  Patient reports some difficulty remembering new information, recent conversations, names. LTM "not quite as bad, depending on the topic". He enjoys going to [place] and activities there.  repeats oneself?  Endorsed Disoriented when walking into a room?  Patient denies    Leaving objects in  unusual places?  denies   Wandering behavior? denies   Any personality changes, or depression, anxiety? "He is a little short sometimes, but not a problem yet"-wife says. Hallucinations or paranoia? denies   Seizures? denies    Any sleep changes?  Sleeps well. He always had vivid dreams, REM behavior or sleepwalking.   Sleep apneaEndorsed, uses a CPAP Any hygiene concerns?  Denies.   Independent of bathing and dressing? Endorsed  Does the patient need help with medications? Patient is in charge   Who is in charge of the finances?  Patient is in charge . "He almost fell victim of a scam,  but he has always been very gelable"-wife says   Any changes in appetite? "Got plenty of it"-increases.     Patient have trouble swallowing?  Denies.   Does the patient cook? No  Any headaches?  Denies.   Chronic pain?  He reports some arthritic pain, but this is not severe. Ambulates with difficulty? Denies  Recent falls or head injuries? Last fall was during a road trip while he was lifting a suitcase, no LOC, no head injury.      Vision changes?  Denies any new issues.  Needs glasses  Any strokelike symptoms? Denies.   Any tremors? Mild ET, familial, but not interfering with the ADLs.  Denies any other parkinsonian complaints Any anosmia? Denies.   Any incontinence of urine?  Urge and Stress incontinence, wears pads  Any bowel dysfunction?  Intermittent and stool incontinence during the last 2 years.  He is to see GI Patient lives with wife [place] a farm History of heavy alcohol intake? Denies.   History of heavy tobacco use? Denies.   Family history of dementia?  Denies Does patient drive? Yes, but "I let her do most of it because my reaction time is down for the last 5 years".    Retired Cytogeneticist degree  Neuropsych evaluation 10/12/2023 Briefly, results suggested performance variability surrounding verbal fluency (semantic worse than phonemic) and encoding (i.e., learning) aspects of memory. No consistent cognitive impairments were exhibited. 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 This, when combined with [name] history of various chronic medical ailments, represents the most likely culprit for weakness/variability across isolated areas of testing. A history of essential tremor could also reasonably contribute to current testing patterns. Specific to memory, [name] was able to consistently retain previously learned information after lengthy delays. Overall, memory performance combined with intact performances across other areas of  cognitive functioning is not suggestive of presently symptomatic Alzheimer's disease.   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   Benign essential hypertension    Cerebrovascular disease    Disease of esophagus, unspecified    Ingrown nail 01/04/2018   Leg edema, right 05/15/2018   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   aspirin  EC 81 MG tablet Take 1 tablet (81 mg total) by mouth daily. Swallow whole.   carvedilol (COREG) 6.25 MG tablet Take 6.25 mg by mouth 2 (two) times daily with a meal.   Cholecalciferol (VITAMIN D) 50 MCG (2000 UT) CAPS Take 2,000 Units by mouth daily.   dapagliflozin propanediol (FARXIGA) 10 MG TABS tablet Take 10 mg by mouth daily.   fexofenadine (ALLEGRA) 180 MG tablet Take 180 mg by mouth daily.    lansoprazole (PREVACID) 30 MG capsule Take 30 mg by mouth in the morning.   lisinopril (ZESTRIL) 10 MG tablet Take 10 mg by mouth daily.   memantine  (NAMENDA ) 5 MG tablet TAKE 1 TABLET TWICE A DAY   metFORMIN (GLUCOPHAGE) 1000 MG tablet Take 1,000 mg by mouth daily with breakfast.   Niacinamide-Zn-Cu-Methfo-Se-Cr (NICOTINAMIDE PO) Take 1 tablet by mouth 2 (two) times daily. Strength unknown (Patient not taking: Reported on 08/11/2023)   nitroGLYCERIN  (NITROSTAT ) 0.4 MG SL tablet Place 1 tablet (0.4 mg total) under the tongue every 5 (five) minutes as needed.   rosuvastatin (CRESTOR) 40 MG tablet Take by mouth at bedtime. Takes 20mg  daily   Semaglutide (OZEMPIC, 1 MG/DOSE, Bivalve) Inject 1 mg into the skin once a week.   No facility-administered encounter medications on file as of 11/08/2023.     Objective:     PHYSICAL EXAMINATION:    VITALS:   Vitals:   11/08/23 1459  BP: 116/72  Pulse: 76  Resp: 20  SpO2: 98%    GEN:  The patient appears stated age and is in NAD. HEENT:  Normocephalic, atraumatic.   Neurological examination:  General: NAD, well-groomed, appears stated age. Orientation: The patient is alert. Oriented to person, place and not to date.   Cranial nerves: There is good facial symmetry.The speech is fluent and clear. No aphasia or dysarthria. Fund of knowledge is appropriate. Recent memory impaired and remote memory is normal.  Attention and concentration are reduced.  Able to name objects and repeat phrases.  Hearing is intact to conversational tone. b Sensation: Sensation is intact to light touch throughout Motor: Strength is at least antigravity x4. DTR's 2/4 in UE/LE      08/16/2023    1:00 PM  Montreal Cognitive Assessment   Visuospatial/ Executive (0/5) 3  Naming (0/3) 3  Attention: Read list of digits (0/2) 2  Attention: Read list of letters (0/1) 1  Attention: Serial 7 subtraction starting at 100 (0/3) 2  Language: Repeat phrase (0/2) 1  Language :  Fluency (0/1) 0  Abstraction (0/2) 2  Delayed Recall (0/5) 2  Orientation (0/6) 6  Total 22  Adjusted Score (based on education) 22       11/22/2018    8:00 AM  MMSE - Mini Mental State Exam  Orientation to time 5  Orientation to Place 5  Registration 3  Attention/ Calculation 5  Recall 3  Language- name 2  objects 2  Language- repeat 1  Language- follow 3 step command 3  Language- read & follow direction 1  Write a sentence 1  Copy design 1  Total score 30       Movement examination: Tone: There is normal tone in the UE/LE Abnormal movements:  no tremor.  No myoclonus.  No asterixis.   Coordination:  There is no decremation with RAM's. Normal finger to nose  Gait and Station: The patient has no difficulty arising out of a deep-seated chair without the use of the hands. The patient's stride length is good.  Gait is cautious and narrow.  Decreased arm swing.  Thank you for allowing us  the opportunity to participate in the care of this nice patient. Please do not hesitate to contact us  for any questions or concerns.   Total time spent on today's visit was 30 minutes dedicated to this patient today, preparing to see patient, examining the patient, ordering tests and/or medications and counseling the patient, documenting clinical information in the EHR or other health record, independently interpreting results and communicating results to the patient/family, discussing treatment and goals, answering patient's questions and coordinating care.  Cc:  Alejandro Hurt, FNP  Tex Filbert 11/08/2023 4:46 PM

## 2023-11-08 NOTE — Patient Instructions (Signed)
 It was a pleasure to see you today at our office.   Recommendations:    Keep using the CPAP       Continue  Memantine  5 mg twice a day.    Follow up in 6 months     For psychiatric meds, mood meds: Please have your primary care physician manage these medications.  If you have any severe symptoms of a stroke, or other severe issues such as confusion,severe chills or fever, etc call 911 or go to the ER as you may need to be evaluated further     For assessment of decision of mental capacity and competency:  Call Dr. Laverne Potter, geriatric psychiatrist at [phone]  Counseling regarding caregiver distress, including caregiver depression, anxiety and issues regarding community resources, adult day care programs, adult living facilities, or memory care questions:  please contact your  Primary Doctor's Social Worker   Whom to call: Memory  decline, memory medications: Call our office [phone]    [email or web address]   RECOMMENDATIONS FOR ALL PATIENTS WITH MEMORY PROBLEMS: 1. Continue to exercise (Recommend 30 minutes of walking everyday, or 3 hours every week) 2. Increase social interactions - continue going to [place] and enjoy social gatherings with friends and family 3. Eat healthy, avoid fried foods and eat more fruits and vegetables 4. Maintain adequate blood pressure, blood sugar, and blood cholesterol level. Reducing the risk of stroke and cardiovascular disease also helps promoting better memory. 5. Avoid stressful situations. Live a simple life and avoid aggravations. Organize your time and prepare for the next day in anticipation. 6. Sleep well, avoid any interruptions of sleep and avoid any distractions in the bedroom that may interfere with adequate sleep quality 7. Avoid sugar, avoid sweets as there is a strong link between excessive sugar intake, diabetes, and cognitive impairment We discussed the Mediterranean  diet, which has been shown to help patients reduce the risk of progressive memory disorders and reduces cardiovascular risk. This includes eating fish, eat fruits and green leafy vegetables, nuts like almonds and hazelnuts, walnuts, and also use olive oil. Avoid fast foods and fried foods as much as possible. Avoid sweets and sugar as sugar use has been linked to worsening of memory function.  There is always a concern of gradual progression of memory problems. If this is the case, then we may need to adjust level of care according to patient needs. Support, both to the patient and caregiver, should then be put into place.         FALL PRECAUTIONS: Be cautious when walking. Scan the area for obstacles that may increase the risk of trips and falls. When getting up in the mornings, sit up at the edge of the bed for a few minutes before getting out of bed. Consider elevating the bed at the head end to avoid drop of blood pressure when getting up. Walk always in a well-lit room (use night lights in the walls). Avoid area rugs or power cords from appliances in the middle of the walkways. Use a walker or a cane if necessary and consider physical therapy for balance exercise. Get your eyesight checked regularly.  FINANCIAL OVERSIGHT: Supervision, especially oversight when making financial decisions or transactions is also recommended.  HOME SAFETY: Consider the safety of the kitchen when operating appliances like stoves, microwave oven, and blender. Consider having supervision and share cooking responsibilities until no longer able to participate in those. Accidents with firearms and other hazards in the house should be identified and  addressed as well.   ABILITY TO BE LEFT ALONE: If patient is unable to contact 911 operator, consider using LifeLine, or when the need is there, arrange for someone to stay with patients. Smoking is a fire hazard, consider supervision or cessation. Risk of wandering should be  assessed by caregiver and if detected at any point, supervision and safe proof recommendations should be instituted.  MEDICATION SUPERVISION: Inability to self-administer medication needs to be constantly addressed. Implement a mechanism to ensure safe administration of the medications.      Mediterranean Diet A Mediterranean diet refers to food and lifestyle choices that are based on the traditions of countries located on the [employer] [place]. This way of eating has been shown to help prevent certain conditions and improve outcomes for people who have chronic diseases, like kidney disease and heart disease. What are tips for following this plan? Lifestyle  Cook and eat meals together with your family, when possible. Drink enough fluid to keep your urine clear or pale yellow. Be physically active every day. This includes: Aerobic exercise like running or swimming. Leisure activities like gardening, walking, or housework. Get 7-8 hours of sleep each night. If recommended by your health care provider, drink red wine in moderation. This means 1 glass a day for nonpregnant women and 2 glasses a day for men. A glass of wine equals 5 oz (150 mL). Reading food labels  Check the serving size of packaged foods. For foods such as rice and pasta, the serving size refers to the amount of cooked product, not dry. Check the total fat in packaged foods. Avoid foods that have saturated fat or trans fats. Check the ingredients list for added sugars, such as corn syrup. Shopping  At the grocery store, buy most of your food from the areas near the walls of the store. This includes: Fresh fruits and vegetables (produce). Grains, beans, nuts, and seeds. Some of these may be available in unpackaged forms or large amounts (in bulk). Fresh seafood. Poultry and eggs. Low-fat dairy products. Buy whole ingredients instead of prepackaged foods. Buy fresh fruits and vegetables in-season from local farmers  markets. Buy frozen fruits and vegetables in resealable bags. If you do not have access to quality fresh seafood, buy precooked frozen shrimp or canned fish, such as tuna, salmon, or sardines. Buy small amounts of raw or cooked vegetables, salads, or olives from the deli or salad bar at your store. Stock your pantry so you always have certain foods on hand, such as olive oil, canned tuna, canned tomatoes, rice, pasta, and beans. Cooking  Cook foods with extra-virgin olive oil instead of using butter or other vegetable oils. Have meat as a side dish, and have vegetables or grains as your main dish. This means having meat in small portions or adding small amounts of meat to foods like pasta or stew. Use beans or vegetables instead of meat in common dishes like chili or lasagna. Experiment with different cooking methods. Try roasting or broiling vegetables instead of steaming or sauteing them. Add frozen vegetables to soups, stews, pasta, or rice. Add nuts or seeds for added healthy fat at each meal. You can add these to yogurt, salads, or vegetable dishes. Marinate fish or vegetables using olive oil, lemon juice, garlic, and fresh herbs. Meal planning  Plan to eat 1 vegetarian meal one day each week. Try to work up to 2 vegetarian meals, if possible. Eat seafood 2 or more times a week. Have healthy snacks readily available,  such as: Vegetable sticks with hummus. Greek yogurt. Fruit and nut trail mix. Eat balanced meals throughout the week. This includes: Fruit: 2-3 servings a day Vegetables: 4-5 servings a day Low-fat dairy: 2 servings a day Fish, poultry, or lean meat: 1 serving a day Beans and legumes: 2 or more servings a week Nuts and seeds: 1-2 servings a day Whole grains: 6-8 servings a day Extra-virgin olive oil: 3-4 servings a day Limit red meat and sweets to only a few servings a month What are my food choices? Mediterranean diet Recommended Grains: Whole-grain pasta. Brown  rice. Bulgar wheat. Polenta. Couscous. Whole-wheat bread. Dwyane Glad. Vegetables: Artichokes. Beets. Broccoli. Cabbage. Carrots. Eggplant. Green beans. Chard. Kale. Spinach. Onions. Leeks. Peas. Squash. Tomatoes. Peppers. Radishes. Fruits: Apples. Apricots. Avocado. Berries. Bananas. Cherries. Dates. Figs. Grapes. Lemons. Melon. Oranges. Peaches. Plums. Pomegranate. Meats and other protein foods: Beans. Almonds. Sunflower seeds. Pine nuts. Peanuts. Cod. Salmon. Scallops. Shrimp. Tuna. Tilapia. Clams. Oysters. Eggs. Dairy: Low-fat milk. Cheese. Greek yogurt. Beverages: Water. Red wine. Herbal tea. Fats and oils: Extra virgin olive oil. Avocado oil. Grape seed oil. Sweets and desserts: [place] yogurt with honey. Baked apples. Poached pears. Trail mix. Seasoning and other foods: Basil. Cilantro. Coriander. Cumin. Mint. Parsley. Sage. Rosemary. Tarragon. Garlic. Oregano. Thyme. Pepper. Balsalmic vinegar. Tahini. Hummus. Tomato sauce. Olives. Mushrooms. Limit these Grains: Prepackaged pasta or rice dishes. Prepackaged cereal with added sugar. Vegetables: Deep fried potatoes (french fries). Fruits: Fruit canned in syrup. Meats and other protein foods: Beef. Pork. Lamb. Poultry with skin. Hot dogs. Helene Loader. Dairy: Ice cream. Sour cream. Whole milk. Beverages: Juice. Sugar-sweetened soft drinks. Beer. Liquor and spirits. Fats and oils: Butter. Canola oil. Vegetable oil. Beef fat (tallow). Lard. Sweets and desserts: Cookies. Cakes. Pies. Candy. Seasoning and other foods: Mayonnaise. Premade sauces and marinades. The items listed may not be a complete list. Talk with your dietitian about what dietary choices are right for you. Summary The Mediterranean diet includes both food and lifestyle choices. Eat a variety of fresh fruits and vegetables, beans, nuts, seeds, and whole grains. Limit the amount of red meat and sweets that you eat. Talk with your health care provider about whether it is safe for you  to drink red wine in moderation. This means 1 glass a day for nonpregnant women and 2 glasses a day for men. A glass of wine equals 5 oz (150 mL). This information is not intended to replace advice given to you by your health care provider. Make sure you discuss any questions you have with your health care provider. Document Released: 01/21/2016 Document Revised: 02/23/2016 Document Reviewed: 01/21/2016 Elsevier Interactive Patient Education  2017 [employer].

## 2023-11-27 ENCOUNTER — Ambulatory Visit: Admitting: Cardiology

## 2023-12-21 ENCOUNTER — Other Ambulatory Visit: Payer: Self-pay

## 2023-12-21 ENCOUNTER — Ambulatory Visit

## 2023-12-21 VITALS — BP 128/80 | HR 79 | Resp 18 | Ht 70.0 in | Wt 224.6 lb

## 2023-12-21 DIAGNOSIS — I10 Essential (primary) hypertension: Secondary | ICD-10-CM | POA: Diagnosis not present

## 2023-12-21 DIAGNOSIS — I2510 Atherosclerotic heart disease of native coronary artery without angina pectoris: Secondary | ICD-10-CM

## 2023-12-21 DIAGNOSIS — E7800 Pure hypercholesterolemia, unspecified: Secondary | ICD-10-CM

## 2023-12-21 MED ORDER — ASPIRIN 81 MG PO TBEC: 81.0000 mg | DELAYED_RELEASE_TABLET | Freq: Every day | ORAL | Status: AC

## 2023-12-21 NOTE — Assessment & Plan Note (Signed)
 Well-controlled. Currently remains on amlodipine 5 mg once daily Carvedilol 6.25 mg twice daily and lisinopril 10 mg once daily. Tolerating well.

## 2023-12-21 NOTE — Patient Instructions (Signed)
 Medication Instructions:  Your physician has recommended you make the following change in your medication:   START: Aspirin  81 mg daily  *If you need a refill on your cardiac medications before your next appointment, please call your pharmacy*  Lab Work: None If you have labs (blood work) drawn today and your tests are completely normal, you will receive your results only by: MyChart Message (if you have MyChart) OR A paper copy in the mail If you have any lab test that is abnormal or we need to change your treatment, we will call you to review the results.  Testing/Procedures: None  Follow-Up: At [facility], you and your health needs are our priority.  As part of our continuing mission to provide you with exceptional heart care, our providers are all part of one team.  This team includes your primary Cardiologist (physician) and Advanced Practice Providers or APPs (Physician Assistants and Nurse Practitioners) who all work together to provide you with the care you need, when you need it.  Your next appointment:   1 year(s)  Provider:   Alean Kobus, MD    We recommend signing up for the patient portal called MyChart.  Sign up information is provided on this After Visit Summary.  MyChart is used to connect with patients for Virtual Visits (Telemedicine).  Patients are able to view lab/test results, encounter notes, upcoming appointments, etc.  Non-urgent messages can be sent to your provider as well.   To learn more about what you can do with MyChart, go to [email or web address].   Other Instructions None

## 2023-12-21 NOTE — Progress Notes (Signed)
 Cardiology Consultation:    Date:  12/21/2023   ID:  Jacob Patel, DOB 18-Feb-1941, MRN [id number]  PCP:  Marvene Prentice SAUNDERS, FNP  Cardiologist:  Alean SAUNDERS Abdinasir Spadafore, MD   Referring MD: Marvene Prentice SAUNDERS, FNP   Chief Complaint  Patient presents with   Follow-up     ASSESSMENT AND PLAN:   Mr. Titzer 83 year old male with history of coronary artery disease identified on cardiac CTA November 2023 with calcium score 2798 and chronic occlusion of proximal RCA and moderate disease of LAD and LCx with LAD lesion with diffuse disease FFR showing gradual sequential drop. Also has a history of diabetes mellitus, hypertension, hyperlipidemia.  Here for routine follow-up visit.   Problem List Items Addressed This Visit     Benign essential hypertension   Well-controlled. Currently remains on amlodipine 5 mg once daily Carvedilol 6.25 mg twice daily and lisinopril 10 mg once daily. Tolerating well.       Relevant Medications   rosuvastatin (CRESTOR) 40 MG tablet   Pure hypercholesterolemia   Lipid panel from 07/12/2023 total cholesterol 195, HDL 40, LDL 120, triglycerides 199. Hemoglobin A1c 7.6.  Continue with current dose of rosuvastatin 40 mg once daily.       Relevant Medications   rosuvastatin (CRESTOR) 40 MG tablet   CAD (coronary artery disease) - Primary   Doing well.  No anginal symptoms. Continue rosuvastatin 40 mg once daily.  Discussed about resuming aspirin  81 mg once daily, previously tolerated well without side effects.  Encouraged to continue this given his underlying CAD history. Has sublingual nitroglycerin  to use but has not required to use any.      Relevant Medications   rosuvastatin (CRESTOR) 40 MG tablet   Other Relevant Orders   EKG 12-Lead    Return to clinic tentatively in 1 year.  Earlier follow-up as needed.  History of Present Illness:    Jacob Patel is a 83 y.o. male who is being seen today for follow-up PCP Marvene Prentice SAUNDERS,  FNP. Last visit with us  in the office was 11/17/2022 with Dr. Monetta.  Has history of coronary artery disease identified on cardiac CTA November 2023 with calcium score 2798 and chronic occlusion of proximal RCA and moderate disease of LAD and LCx with LAD lesion with diffuse disease FFR showing gradual sequential drop. Also has a history of diabetes mellitus, hypertension, hyperlipidemia.  EKG in the clinic today shows sinus rhythm heart rate 79/min PR interval mildly prolonged 210 ms, QRS duration 94 ms, no ischemic changes.  Here for the visit today accompanied by his wife.  Overall from cardiac standpoint doing well.  Keeps himself busy with household activities and does enjoy ride on lawn more. Denies any chest pain or shortness of breath, orthopnea, proximal nocturnal dyspnea.  Does have mild bilateral lower extremity edema for which he uses compression socks. Denies any lightheadedness, dizziness, syncopal episodes Does report occasional falls from tripping over objects.  Good compliance with all his medications However notably he is currently not on aspirin .  Has taken it previously without any side effects.  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   carvedilol (COREG) 6.25 MG tablet Take 6.25 mg by mouth 2 (two) times daily with a meal.   Cholecalciferol (VITAMIN D) 50 MCG (2000 UT) CAPS Take 2,000 Units by mouth daily.   dapagliflozin propanediol (FARXIGA) 10 MG TABS tablet Take 10 mg by mouth daily.   fexofenadine (ALLEGRA) 180 MG tablet Take 180 mg by mouth daily.   finasteride (PROPECIA) 1 MG tablet Take 0.5 mg by mouth daily.   lansoprazole (PREVACID) 30 MG capsule Take 30 mg by mouth in the morning.   lisinopril (ZESTRIL) 10 MG tablet Take 10 mg by mouth daily.   memantine  (NAMENDA ) 5 MG tablet TAKE 1 TABLET TWICE A DAY   metFORMIN (GLUCOPHAGE) 1000 MG tablet Take 1,000 mg by mouth daily with breakfast.   Multiple Vitamins-Minerals (PRESERVISION AREDS 2 PO) Take 2 tablets by mouth daily in the afternoon.   nitroGLYCERIN  (NITROSTAT ) 0.4 MG SL tablet Place 1 tablet (0.4 mg total) under the tongue every 5 (five) minutes as needed.   OZEMPIC, 2 MG/DOSE, 8 MG/3ML SOPN Inject 2 mg as directed once a week.   rosuvastatin (CRESTOR) 40 MG tablet Take 40 mg by mouth daily.   tamsulosin (FLOMAX) 0.4 MG CAPS capsule Take 0.4 mg by mouth daily.     Allergies:   Patient has no known allergies.   Social History   Socioeconomic History   Marital status: Married    Spouse name: PHYLLIS   Number of children: 3   Years of education: 16   Highest education level: Bachelor's degree (e.g., BA, AB, BS)  Occupational History   Occupation: Retired  Tobacco Use   Smoking status: Never   Smokeless tobacco: Never  Vaping Use   Vaping status: Never Used  Substance and Sexual Activity   Alcohol use: Never   Drug use: Never   Sexual activity: Not Currently  Other Topics Concern   Not on file  Social History Narrative   Caffeine use: mild consumption   Has POA   QUAKER FAITH - NO RELIGIOUS, ETHNICAL, OR  CULTURAL CONCERNS   16 years of school   Right handed   Caffeine yes   8 great children   Social Drivers of Corporate investment banker Strain: Not on file  Food Insecurity: Not on file  Transportation Needs: Not on file  Physical Activity: Not on file  Stress: Not on file  Social Connections: Not on file     Family History: The patient's family history includes CAD in his brother, father, and mother; Cancer in his maternal aunt; Congestive Heart Failure (age of onset: 14) in his father; Coronary artery disease in his brother; GI problems in his brother and father; Heart Problems in his mother; Testicular cancer in his paternal uncle; Tremor in his father and mother; Valvular heart disease in his brother. ROS:   Please see the history of present illness.  All 14 point review of systems negative except as described per history of present illness.  EKGs/Labs/Other Studies Reviewed:    The following studies were reviewed today:   EKG:       Recent Labs: 08/11/2023: TSH 3.09  Recent Lipid Panel No results found for: CHOL, TRIG, HDL, CHOLHDL, VLDL, LDLCALC, LDLDIRECT  Physical Exam:    VS:  BP 128/80 (BP Location: Left Arm, Patient Position: Sitting, Cuff Size: Normal)   Pulse 79   Resp 18   Ht 5' 10 (1.778 m)   Wt 224 lb 9.6 oz (101.9 kg)   SpO2 94%   BMI 32.23 kg/m     Wt Readings from Last 3 Encounters:  12/21/23 224 lb 9.6 oz (101.9 kg)  08/11/23 226 lb (102.5 kg)  11/17/22 229 lb (103.9 kg)     GENERAL:  Well nourished, well developed in no acute distress NECK: No JVD; No carotid bruits CARDIAC: RRR, S1 and S2 present, no murmurs, no rubs, no gallops CHEST:  Clear to auscultation without rales, wheezing or rhonchi  Extremities: No pedal edema at this time with bilateral compression socks on. Pulses bilaterally symmetric with radial 2+ and dorsalis pedis 2+ NEUROLOGIC:  Alert and oriented x 3  Medication Adjustments/Labs and Tests  Ordered: Current medicines are reviewed at length with the patient today.  Concerns regarding medicines are outlined above.  Orders Placed This Encounter  Procedures   EKG 12-Lead   No orders of the defined types were placed in this encounter.   Signed, Exzavier Ruderman reddy Tikia Skilton, MD, MPH, [facility]. 12/21/2023 9:58 AM    [facility]

## 2023-12-21 NOTE — Assessment & Plan Note (Signed)
 Lipid panel from 07/12/2023 total cholesterol 195, HDL 40, LDL 120, triglycerides 199. Hemoglobin A1c 7.6.  Continue with current dose of rosuvastatin 40 mg once daily.

## 2023-12-21 NOTE — Assessment & Plan Note (Addendum)
 Doing well.  No anginal symptoms. Continue rosuvastatin 40 mg once daily.  Discussed about resuming aspirin  81 mg once daily, previously tolerated well without side effects.  Encouraged to continue this given his underlying CAD history. Has sublingual nitroglycerin  to use but has not required to use any.

## 2024-01-12 ENCOUNTER — Ambulatory Visit: Admitting: Cardiology

## 2024-05-13 NOTE — Progress Notes (Incomplete)
 Jacob Patel is a very pleasant 83 y.o. 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 Patient is on memantine  5 mg bid (unable to tolerate donepezil). This patient is accompanied in the office by his wife ***  who supplements the history. Previous records as well as any outside records available were reviewed prior to todays visit.   Patient was last seen on ***. Memory is ***. MMSE today is  /30.Patient is able to participate on ADLs and to to drive without difficulties. Mood is ***    Assessment & Plan     Continue Memantine  5 mg twice daily. Side effects were discussed  Recommend using CPAP for OSA Recommend good control of cardiovascular risk factors.    Continue to control mood as per PCP     Discussed the use of AI scribe software for clinical note transcription with the patient, who gave verbal consent to proceed.  History of Present Illness       Any changes in memory since last visit?  About the same. When he starts the thought process he stops before completing it. He was trying to jump the car and forgot what the next step was and kept looking at the charger-wife says.  repeats oneself?  Endorsed Disoriented when walking into a room?  Patient denies    Misplacing objects?  Patient denies   Wandering behavior?   Denies. Any personality changes since last visit? Denies.   Any worsening depression?: denies.   Hallucinations or paranoia?  Denies.   Seizures?   Denies.    Any sleep changes? Sleeps well.  Denies vivid dreams, REM behavior or sleepwalking   Sleep apnea? Endorsed, uses the CPAP  Any hygiene concerns?   Denies.   Independent  of bathing and dressing?  Endorsed  Does the patient needs help with medications? Patient is in charge   Who is in charge of the finances?  Patient is in charge      Any changes in appetite?  denies    Patient have trouble swallowing?  Denies.   Does the patient cook? No  Any kitchen accidents such as leaving the stove on?   Denies.   Any headaches?    Denies.   Vision changes? Denies. Chronic pain?  Denies.   Ambulates with difficulty?    Denies.    Recent falls or head injuries?    Denies.      Unilateral weakness, numbness or tingling?  Denies.   Any tremors? Not worse. Mild ET, familial, but not interfering with ADLs.  Any anosmia?    Denies.   Any incontinence of urine?  Denies.   Any bowel dysfunction?  Has a history of stool incontinence and intermittent diarrhea      Patient lives with his wife . Does the patient drive? Very little, very short distances     Initial visit 08/11/2023 How long did patient have memory difficulties?  For about 15 years, with gradual changes.  He had initially been seen in June 2020 by [facility].  Then losing to follow-up.  Patient reports some difficulty remembering new information, recent conversations, names. LTM not quite as bad, depending on the topic. He enjoys going to [employer]  and activities there.  repeats oneself?  Endorsed Disoriented when walking into a room?  Patient denies    Leaving objects in unusual places?  denies   Wandering behavior? denies   Any personality changes, or depression, anxiety? He is a little short sometimes, but not a problem yet-wife says. Hallucinations or paranoia? denies   Seizures? denies    Any sleep changes?  Sleeps well. He always had vivid dreams, REM behavior or sleepwalking.   Sleep apneaEndorsed, uses a CPAP Any hygiene concerns?  Denies.   Independent of bathing and dressing? Endorsed  Does the patient need help with medications? Patient is in charge   Who is in charge of the finances?  Patient is in charge  . He almost fell victim of a scam, but he has always been very gelable-wife says   Any changes in appetite? Got plenty of it-increases.     Patient have trouble swallowing?  Denies.   Does the patient cook? No  Any headaches?  Denies.   Chronic pain?  He reports some arthritic pain, but this is not severe. Ambulates with difficulty? Denies  Recent falls or head injuries? Last fall was during a road trip while he was lifting a suitcase, no LOC, no head injury.      Vision changes?  Denies any new issues.  Needs glasses  Any strokelike symptoms? Denies.   Any tremors? Mild ET, familial, but not interfering with the ADLs.  Denies any other parkinsonian complaints Any anosmia? Denies.   Any incontinence of urine?  Urge and Stress incontinence, wears pads  Any bowel dysfunction?  Intermittent and stool incontinence during the last 2 years.  He is to see GI Patient [place] farm History of heavy alcohol intake? Denies.   History of heavy tobacco use? Denies.   Family history of dementia?  Denies Does patient drive? Yes, but I let her do most of it because my reaction time is down for the last 5 years.    Retired Cytogeneticist degree   Neuropsych evaluation 10/12/2023 Briefly, results suggested performance variability surrounding verbal fluency (semantic worse than phonemic) and encoding (i.e., learning) aspects of memory. No consistent cognitive impairments were exhibited. 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 This, when combined with Mr. Wyles history of various chronic medical ailments, represents the most likely culprit for weakness/variability across isolated areas of testing. A history of essential tremor could also reasonably contribute to current testing patterns. Specific to memory, Mr. Bonenberger was able to consistently retain previously learned information after lengthy delays. Overall, memory performance combined with intact  performances across other areas of cognitive functioning is not suggestive of presently symptomatic Alzheimer's disease.    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   Benign essential hypertension    Cerebrovascular disease    Disease of esophagus, unspecified    Ingrown nail 01/04/2018   Leg edema, right 05/15/2018  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  GEN:  The patient appears stated age and is in NAD. HEENT:  Normocephalic, atraumatic.   Neurological examination:  General: NAD, well-groomed, appears stated age. Orientation: The patient is alert. Oriented to person, place and not to date.*** Cranial nerves: There is good facial symmetry.The speech is fluent and clear. No aphasia or dysarthria. Fund of knowledge is appropriate. Recent  memory impaired and remote memory is normal.  Attention and concentration are normal.  Able to name objects and repeat phrases.  Hearing is intact to conversational tone ***.   Delayed recall *** Sensation: Sensation is intact to light touch throughout Motor: Strength is at least antigravity x4. DTR's 2/4 in UE/LE      08/16/2023    1:00 PM  Montreal Cognitive Assessment   Visuospatial/ Executive (0/5) 3  Naming (0/3) 3  Attention: Read list of digits (0/2) 2  Attention: Read list of letters (0/1) 1  Attention: Serial 7 subtraction starting at 100 (0/3) 2  Language: Repeat phrase (0/2) 1  Language : Fluency (0/1) 0  Abstraction (0/2) 2  Delayed Recall (0/5) 2  Orientation (0/6) 6  Total 22  Adjusted Score (based on education) 22       11/22/2018    8:00 AM  MMSE - Mini Mental State Exam  Orientation to time 5  Orientation to Place 5  Registration 3  Attention/ Calculation 5  Recall 3  Language- name 2 objects 2  Language- repeat 1  Language- follow 3 step command 3  Language- read & follow direction 1  Write a sentence 1  Copy design 1  Total score 30     Results     Movement examination: Tone: There is normal tone in the UE/LE Abnormal movements:  no tremor.  No myoclonus.  No asterixis.   Coordination:  There is no decremation with RAM's. Normal finger to nose  Gait and Station: The patient has no difficulty arising out of a deep-seated chair without the use of the hands. The patient's stride length is good.  Gait is cautious and narrow.   Thank you for allowing us  the opportunity to participate in the care of this nice patient. Please do not hesitate to contact us  for any questions or concerns.   Total time spent on today's visit was *** minutes dedicated to this patient today, preparing to see patient, examining the patient, ordering tests and/or medications and counseling the patient, documenting clinical information in the EHR or other health record,  independently interpreting results and communicating results to the patient/family, discussing treatment and goals, answering patient's questions and coordinating care.  Cc:  Marvene Prentice SAUNDERS, FNP  Camie Sevin 05/13/2024 5:53 AM

## 2024-05-15 ENCOUNTER — Ambulatory Visit: Admitting: Physician Assistant

## 2024-05-20 ENCOUNTER — Ambulatory Visit (INDEPENDENT_AMBULATORY_CARE_PROVIDER_SITE_OTHER): Admitting: Physician Assistant

## 2024-05-20 VITALS — BP 100/62 | HR 86 | Resp 20 | Ht 70.0 in | Wt 224.0 lb

## 2024-05-20 DIAGNOSIS — I999 Unspecified disorder of circulatory system: Secondary | ICD-10-CM | POA: Diagnosis not present

## 2024-05-20 DIAGNOSIS — F0670 Mild neurocognitive disorder due to known physiological condition without behavioral disturbance: Secondary | ICD-10-CM | POA: Diagnosis not present

## 2024-05-20 NOTE — Patient Instructions (Signed)
 It was a pleasure to see you today at our office.   Recommendations:    Keep using the CPAP       Continue  Memantine  5 mg twice a day.    Follow up in 6 months     For psychiatric meds, mood meds: Please have your primary care physician manage these medications.  If you have any severe symptoms of a stroke, or other severe issues such as confusion,severe chills or fever, etc call 911 or go to the ER as you may need to be evaluated further     For assessment of decision of mental capacity and competency:  Call Dr. Laverne Potter, geriatric psychiatrist at [phone]  Counseling regarding caregiver distress, including caregiver depression, anxiety and issues regarding community resources, adult day care programs, adult living facilities, or memory care questions:  please contact your  Primary Doctor's Social Worker   Whom to call: Memory  decline, memory medications: Call our office [phone]    [email or web address]   RECOMMENDATIONS FOR ALL PATIENTS WITH MEMORY PROBLEMS: 1. Continue to exercise (Recommend 30 minutes of walking everyday, or 3 hours every week) 2. Increase social interactions - continue going to [place] and enjoy social gatherings with friends and family 3. Eat healthy, avoid fried foods and eat more fruits and vegetables 4. Maintain adequate blood pressure, blood sugar, and blood cholesterol level. Reducing the risk of stroke and cardiovascular disease also helps promoting better memory. 5. Avoid stressful situations. Live a simple life and avoid aggravations. Organize your time and prepare for the next day in anticipation. 6. Sleep well, avoid any interruptions of sleep and avoid any distractions in the bedroom that may interfere with adequate sleep quality 7. Avoid sugar, avoid sweets as there is a strong link between excessive sugar intake, diabetes, and cognitive impairment We discussed the Mediterranean  diet, which has been shown to help patients reduce the risk of progressive memory disorders and reduces cardiovascular risk. This includes eating fish, eat fruits and green leafy vegetables, nuts like almonds and hazelnuts, walnuts, and also use olive oil. Avoid fast foods and fried foods as much as possible. Avoid sweets and sugar as sugar use has been linked to worsening of memory function.  There is always a concern of gradual progression of memory problems. If this is the case, then we may need to adjust level of care according to patient needs. Support, both to the patient and caregiver, should then be put into place.         FALL PRECAUTIONS: Be cautious when walking. Scan the area for obstacles that may increase the risk of trips and falls. When getting up in the mornings, sit up at the edge of the bed for a few minutes before getting out of bed. Consider elevating the bed at the head end to avoid drop of blood pressure when getting up. Walk always in a well-lit room (use night lights in the walls). Avoid area rugs or power cords from appliances in the middle of the walkways. Use a walker or a cane if necessary and consider physical therapy for balance exercise. Get your eyesight checked regularly.  FINANCIAL OVERSIGHT: Supervision, especially oversight when making financial decisions or transactions is also recommended.  HOME SAFETY: Consider the safety of the kitchen when operating appliances like stoves, microwave oven, and blender. Consider having supervision and share cooking responsibilities until no longer able to participate in those. Accidents with firearms and other hazards in the house should be identified and  addressed as well.   ABILITY TO BE LEFT ALONE: If patient is unable to contact 911 operator, consider using LifeLine, or when the need is there, arrange for someone to stay with patients. Smoking is a fire hazard, consider supervision or cessation. Risk of wandering should be  assessed by caregiver and if detected at any point, supervision and safe proof recommendations should be instituted.  MEDICATION SUPERVISION: Inability to self-administer medication needs to be constantly addressed. Implement a mechanism to ensure safe administration of the medications.      Mediterranean Diet A Mediterranean diet refers to food and lifestyle choices that are based on the traditions of countries located on the [employer] [place]. This way of eating has been shown to help prevent certain conditions and improve outcomes for people who have chronic diseases, like kidney disease and heart disease. What are tips for following this plan? Lifestyle  Cook and eat meals together with your family, when possible. Drink enough fluid to keep your urine clear or pale yellow. Be physically active every day. This includes: Aerobic exercise like running or swimming. Leisure activities like gardening, walking, or housework. Get 7-8 hours of sleep each night. If recommended by your health care provider, drink red wine in moderation. This means 1 glass a day for nonpregnant women and 2 glasses a day for men. A glass of wine equals 5 oz (150 mL). Reading food labels  Check the serving size of packaged foods. For foods such as rice and pasta, the serving size refers to the amount of cooked product, not dry. Check the total fat in packaged foods. Avoid foods that have saturated fat or trans fats. Check the ingredients list for added sugars, such as corn syrup. Shopping  At the grocery store, buy most of your food from the areas near the walls of the store. This includes: Fresh fruits and vegetables (produce). Grains, beans, nuts, and seeds. Some of these may be available in unpackaged forms or large amounts (in bulk). Fresh seafood. Poultry and eggs. Low-fat dairy products. Buy whole ingredients instead of prepackaged foods. Buy fresh fruits and vegetables in-season from local farmers  markets. Buy frozen fruits and vegetables in resealable bags. If you do not have access to quality fresh seafood, buy precooked frozen shrimp or canned fish, such as tuna, salmon, or sardines. Buy small amounts of raw or cooked vegetables, salads, or olives from the deli or salad bar at your store. Stock your pantry so you always have certain foods on hand, such as olive oil, canned tuna, canned tomatoes, rice, pasta, and beans. Cooking  Cook foods with extra-virgin olive oil instead of using butter or other vegetable oils. Have meat as a side dish, and have vegetables or grains as your main dish. This means having meat in small portions or adding small amounts of meat to foods like pasta or stew. Use beans or vegetables instead of meat in common dishes like chili or lasagna. Experiment with different cooking methods. Try roasting or broiling vegetables instead of steaming or sauteing them. Add frozen vegetables to soups, stews, pasta, or rice. Add nuts or seeds for added healthy fat at each meal. You can add these to yogurt, salads, or vegetable dishes. Marinate fish or vegetables using olive oil, lemon juice, garlic, and fresh herbs. Meal planning  Plan to eat 1 vegetarian meal one day each week. Try to work up to 2 vegetarian meals, if possible. Eat seafood 2 or more times a week. Have healthy snacks readily available,  such as: Vegetable sticks with hummus. Greek yogurt. Fruit and nut trail mix. Eat balanced meals throughout the week. This includes: Fruit: 2-3 servings a day Vegetables: 4-5 servings a day Low-fat dairy: 2 servings a day Fish, poultry, or lean meat: 1 serving a day Beans and legumes: 2 or more servings a week Nuts and seeds: 1-2 servings a day Whole grains: 6-8 servings a day Extra-virgin olive oil: 3-4 servings a day Limit red meat and sweets to only a few servings a month What are my food choices? Mediterranean diet Recommended Grains: Whole-grain pasta. Brown  rice. Bulgar wheat. Polenta. Couscous. Whole-wheat bread. Dwyane Glad. Vegetables: Artichokes. Beets. Broccoli. Cabbage. Carrots. Eggplant. Green beans. Chard. Kale. Spinach. Onions. Leeks. Peas. Squash. Tomatoes. Peppers. Radishes. Fruits: Apples. Apricots. Avocado. Berries. Bananas. Cherries. Dates. Figs. Grapes. Lemons. Melon. Oranges. Peaches. Plums. Pomegranate. Meats and other protein foods: Beans. Almonds. Sunflower seeds. Pine nuts. Peanuts. Cod. Salmon. Scallops. Shrimp. Tuna. Tilapia. Clams. Oysters. Eggs. Dairy: Low-fat milk. Cheese. Greek yogurt. Beverages: Water. Red wine. Herbal tea. Fats and oils: Extra virgin olive oil. Avocado oil. Grape seed oil. Sweets and desserts: [place] yogurt with honey. Baked apples. Poached pears. Trail mix. Seasoning and other foods: Basil. Cilantro. Coriander. Cumin. Mint. Parsley. Sage. Rosemary. Tarragon. Garlic. Oregano. Thyme. Pepper. Balsalmic vinegar. Tahini. Hummus. Tomato sauce. Olives. Mushrooms. Limit these Grains: Prepackaged pasta or rice dishes. Prepackaged cereal with added sugar. Vegetables: Deep fried potatoes (french fries). Fruits: Fruit canned in syrup. Meats and other protein foods: Beef. Pork. Lamb. Poultry with skin. Hot dogs. Helene Loader. Dairy: Ice cream. Sour cream. Whole milk. Beverages: Juice. Sugar-sweetened soft drinks. Beer. Liquor and spirits. Fats and oils: Butter. Canola oil. Vegetable oil. Beef fat (tallow). Lard. Sweets and desserts: Cookies. Cakes. Pies. Candy. Seasoning and other foods: Mayonnaise. Premade sauces and marinades. The items listed may not be a complete list. Talk with your dietitian about what dietary choices are right for you. Summary The Mediterranean diet includes both food and lifestyle choices. Eat a variety of fresh fruits and vegetables, beans, nuts, seeds, and whole grains. Limit the amount of red meat and sweets that you eat. Talk with your health care provider about whether it is safe for you  to drink red wine in moderation. This means 1 glass a day for nonpregnant women and 2 glasses a day for men. A glass of wine equals 5 oz (150 mL). This information is not intended to replace advice given to you by your health care provider. Make sure you discuss any questions you have with your health care provider. Document Released: 01/21/2016 Document Revised: 02/23/2016 Document Reviewed: 01/21/2016 Elsevier Interactive Patient Education  2017 [employer].

## 2024-05-20 NOTE — Progress Notes (Signed)
 Assessment & Plan   Mild vascular cognitive impairment    Jacob Patel is a very pleasant 83 y.o. 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 Patient is on memantine  5 mg bid (unable to tolerate donepezil). This patient is accompanied in the office by his wife  who supplements the history. Previous records as well as any outside records available were reviewed prior to todays visit.  MMSE today  27/30. Patient is able to participate on ADLs and to to drive without difficulties. Mood is good Memory remains stable with no significant decline. Short-term memory issues persist, particularly with recalling names and recognizing extended family members. Long-term memory is fair.    - Continue memantine  5 mg bid, side effects discussed - Recommended good control of cardiovascular risk factors - Encouraged participation in social activities and staying active -Continue to control mood as per PCP   Essential tremor Mild essential tremor is present but not interfering with activities of daily living. Tremor may have improved slightly, possibly due to regular CPAP use. - Continue to monitor tremor for any interference with activities of daily living  Obstructive sleep apnea Continues to use CPAP machine regularly with no issues. No REM behavior disorder or sleep disturbances reported. - Continue using CPAP machine regularly       Discussed the use of AI scribe software for clinical note transcription with the patient, who gave verbal consent to proceed.  History of Present Illness Jacob Patel is an 83 year old male with mild cognitive impairment who  presents for a follow-up on memory issues. He is accompanied by his wife.  He continues to experience difficulties with short-term memory, such as recalling recent conversations and names, including those of neighbors he has known for a long time. He does not frequently repeat himself, but sometimes his wife does not understand him. He occasionally misplaces objects, such as a fingernail file, but usually remembers where he left them once found. He sometimes forgets to close the refrigerator door.  He wakes up three to four times a night to urinate, for which he is on medication. He uses a CPAP machine regularly and does not experience REM behavior disorder. He wears a diaper at night to prevent falls due to urgency. He had a head injury three weeks ago from hitting his head on a skid steer but did not lose consciousness or experience severe headaches.  He manages his medications 95% of the time, with occasional assistance from his wife. He handles finances with online payments and has no issues with appetite, though he acknowledges not drinking enough water. He experiences mild constipation, with bowel movements every two to three days, and denies stool incontinence.  He continues to drive, preferring to avoid night driving, but has been driving more due to his wife's recent surgery. He enjoys watching instructional YouTube videos related to farming and rural life. He has mild essential tremors. He avoids strenuous activities due to past knee issues.    Initial visit 08/11/2023 How long did patient have memory difficulties?  For about 15 years, with gradual changes.  He had initially been seen in June 2020 by [facility].  Then losing to follow-up.  Patient reports some difficulty remembering new information, recent conversations, names. LTM not quite as bad, depending on the topic. He enjoys going to [place] and activities there.  repeats oneself?  Endorsed Disoriented when walking into a room?  Patient  denies    Leaving objects in unusual places?  denies   Wandering behavior? denies   Any personality changes, or depression, anxiety? He is a little short sometimes, but not a problem yet-wife says. Hallucinations or paranoia? denies   Seizures? denies    Any sleep changes?  Sleeps well. He always had vivid dreams, REM behavior or sleepwalking.   Sleep apneaEndorsed, uses a CPAP Any hygiene concerns?  Denies.   Independent of bathing and dressing? Endorsed  Does the patient need help with medications? Patient is in charge   Who is in charge of the finances?  Patient is in charge . He almost fell victim of a scam, but he has always been very gelable-wife says   Any changes in appetite? Got plenty of it-increases.     Patient have trouble swallowing?  Denies.   Does the patient cook? No  Any headaches?  Denies.   Chronic pain?  He reports some arthritic pain, but this is not severe. Ambulates with difficulty? Denies  Recent falls or head injuries? Last fall was during a road trip while he was lifting a suitcase, no LOC, no head injury.      Vision changes?  Denies any new issues.  Needs glasses  Any strokelike symptoms? Denies.   Any tremors? Mild ET, familial, but not interfering with the ADLs.  Denies any other parkinsonian complaints Any anosmia? Denies.   Any incontinence of urine?  Urge and Stress incontinence, wears pads  Any bowel dysfunction?  Intermittent and stool incontinence during the last 2 years.  He is to see GI Patient lives with wife in a farm History of heavy alcohol intake? Denies.   History of heavy tobacco use? Denies.   Family history of dementia?  Denies Does patient drive? Yes, but I let her do most of it because my reaction time is down for the last 5 years.    Retired Cytogeneticist degree   Neuropsych evaluation 10/12/2023 Briefly, results suggested performance variability surrounding verbal fluency (semantic worse than phonemic) and encoding (i.e.,  learning) aspects of memory. No consistent cognitive impairments were exhibited. 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 This, when combined with Mr. Lorge history of various chronic medical ailments, represents the most likely culprit for weakness/variability across isolated areas of testing. A history of essential tremor could also reasonably contribute to current testing patterns. Specific to memory, Mr. Matlock was able to consistently retain previously learned information after lengthy delays. Overall, memory performance combined with intact performances across other areas of cognitive functioning is not suggestive of presently symptomatic Alzheimer's disease.    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   Benign essential hypertension  Cerebrovascular disease    Disease of esophagus, unspecified    Ingrown nail 01/04/2018   Leg edema, right 05/15/2018   Mild vascular neurocognitive disorder 10/05/2023   Obstructive sleep apnea 03/22/2021   Osteoarthritis    Pure hypercholesterolemia    Tremor    Type 2 diabetes mellitus without complication, without long-term current use of insulin 06/11/2019     Past Surgical History:  Procedure Laterality Date   CARPAL TUNNEL RELEASE Right    HEMORRHOID SURGERY     KNEE ARTHROSCOPY Left    POLYPECTOMY     ROTATOR CUFF REPAIR  Left    VASECTOMY       Prior meds: donepezil ( stool incontinence with diarrhea) Results      Objective:     PHYSICAL EXAMINATION:    VITALS:   Vitals:   05/20/24 1058  BP: 100/62  Pulse: 86  Resp: 20  SpO2: 96%  Weight: 224 lb (101.6 kg)  Height: 5' 10 (1.778 m)    GEN:  The patient appears stated age and is in NAD. HEENT:  Normocephalic, atraumatic.   Neurological examination:  General: NAD, well-groomed, appears stated age. Orientation: The patient is alert. Oriented to person, place and not to date.  Cranial nerves: There is good facial symmetry.The speech is fluent and clear. No aphasia or dysarthria. Fund of knowledge is appropriate. Recent memory impaired and remote memory is normal.  Attention and concentration are normal.  Able to name objects and repeat phrases.  Hearing is intact to conversational tone .   Delayed recall 1/3 Sensation: Sensation is intact to light touch throughout Motor: Strength is at least antigravity x4. DTR's 2/4 in UE/LE      08/16/2023    1:00 PM  Montreal Cognitive Assessment   Visuospatial/ Executive (0/5) 3  Naming (0/3) 3  Attention: Read list of digits (0/2) 2  Attention: Read list of letters (0/1) 1  Attention: Serial 7 subtraction starting at 100 (0/3) 2  Language: Repeat phrase (0/2) 1  Language : Fluency (0/1) 0  Abstraction (0/2) 2  Delayed Recall (0/5) 2  Orientation (0/6) 6  Total 22  Adjusted Score (based on education) 22       05/20/2024   11:00 AM 11/22/2018    8:00 AM  MMSE - Mini Mental State Exam  Orientation to time 4 5  Orientation to Place 5 5  Registration 3 3  Attention/ Calculation 5 5  Recall 1 3  Language- name 2 objects 2 2  Language- repeat 1 1  Language- follow 3 step command 3 3  Language- read & follow direction 1 1  Write a sentence 1 1  Copy design 1 1  Total score 27 30     Results     Movement examination: Tone: There is normal tone in the UE/LE Abnormal movements:  no  tremor.  No myoclonus.  No asterixis.   Coordination:  There is no decremation with RAM's. Normal finger to nose  Gait and Station: The patient has no difficulty arising out of a deep-seated chair without the use of the hands. The patient's stride length is good.  Gait is cautious and narrow.   Thank you for allowing us  the opportunity to participate in the care of this nice patient. Please do not hesitate to contact us  for any questions or concerns.   Total time spent on today's visit was 30 minutes dedicated to this patient today, preparing to see patient, examining the patient, ordering tests  and/or medications and counseling the patient, documenting clinical information in the EHR or other health record, independently interpreting results and communicating results to the patient/family, discussing treatment and goals, answering patient's questions and coordinating care.  Cc:  Marvene Prentice SAUNDERS, FNP  Camie Sevin 05/20/2024 12:27 PM

## 2024-11-18 ENCOUNTER — Ambulatory Visit: Admitting: Physician Assistant
